# Patient Record
Sex: Female | Born: 1945
Health system: Southern US, Community
[De-identification: ages and names within clinical notes are randomized; demographics above are authoritative.]

## PROBLEM LIST (undated history)

## (undated) DIAGNOSIS — G8929 Other chronic pain: Secondary | ICD-10-CM

## (undated) DIAGNOSIS — L280 Lichen simplex chronicus: Secondary | ICD-10-CM

## (undated) DIAGNOSIS — M549 Dorsalgia, unspecified: Secondary | ICD-10-CM

## (undated) DIAGNOSIS — C801 Malignant (primary) neoplasm, unspecified: Secondary | ICD-10-CM

## (undated) DIAGNOSIS — Z923 Personal history of irradiation: Secondary | ICD-10-CM

## (undated) DIAGNOSIS — C50919 Malignant neoplasm of unspecified site of unspecified female breast: Secondary | ICD-10-CM

## (undated) DIAGNOSIS — M1990 Unspecified osteoarthritis, unspecified site: Secondary | ICD-10-CM

## (undated) DIAGNOSIS — K449 Diaphragmatic hernia without obstruction or gangrene: Secondary | ICD-10-CM

## (undated) DIAGNOSIS — Z9289 Personal history of other medical treatment: Secondary | ICD-10-CM

## (undated) DIAGNOSIS — J189 Pneumonia, unspecified organism: Secondary | ICD-10-CM

## (undated) DIAGNOSIS — E785 Hyperlipidemia, unspecified: Secondary | ICD-10-CM

## (undated) DIAGNOSIS — Z9221 Personal history of antineoplastic chemotherapy: Secondary | ICD-10-CM

## (undated) HISTORY — DX: Hyperlipidemia, unspecified: E78.5

## (undated) HISTORY — PX: DILATION AND CURETTAGE OF UTERUS: SHX78

## (undated) HISTORY — PX: COLON SURGERY: SHX602

## (undated) HISTORY — PX: CYST REMOVAL TRUNK: SHX6283

## (undated) HISTORY — PX: OVARY SURGERY: SHX727

## (undated) HISTORY — PX: BREAST CYST EXCISION: SHX579

## (undated) HISTORY — DX: Unspecified osteoarthritis, unspecified site: M19.90

## (undated) HISTORY — PX: ABDOMINAL HYSTERECTOMY: SHX81

---

## 2000-08-03 DIAGNOSIS — Z9221 Personal history of antineoplastic chemotherapy: Secondary | ICD-10-CM

## 2000-08-03 DIAGNOSIS — Z923 Personal history of irradiation: Secondary | ICD-10-CM

## 2000-08-03 DIAGNOSIS — C50919 Malignant neoplasm of unspecified site of unspecified female breast: Secondary | ICD-10-CM

## 2000-08-03 HISTORY — DX: Personal history of antineoplastic chemotherapy: Z92.21

## 2000-08-03 HISTORY — DX: Personal history of irradiation: Z92.3

## 2000-08-03 HISTORY — DX: Malignant neoplasm of unspecified site of unspecified female breast: C50.919

## 2000-08-03 HISTORY — PX: LYMPHADENECTOMY: SHX15

## 2000-08-03 HISTORY — PX: BREAST LUMPECTOMY: SHX2

## 2001-01-01 DIAGNOSIS — C50919 Malignant neoplasm of unspecified site of unspecified female breast: Secondary | ICD-10-CM | POA: Insufficient documentation

## 2010-12-12 ENCOUNTER — Emergency Department (HOSPITAL_COMMUNITY)
Admission: EM | Admit: 2010-12-12 | Discharge: 2010-12-12 | Discharge status: Against Medical Advice | Payer: BC Managed Care – PPO | Attending: Emergency Medicine | Admitting: Emergency Medicine

## 2010-12-12 DIAGNOSIS — R109 Unspecified abdominal pain: Secondary | ICD-10-CM | POA: Insufficient documentation

## 2010-12-12 DIAGNOSIS — M549 Dorsalgia, unspecified: Secondary | ICD-10-CM | POA: Insufficient documentation

## 2012-09-20 ENCOUNTER — Emergency Department (HOSPITAL_COMMUNITY)
Admission: EM | Admit: 2012-09-20 | Discharge: 2012-09-20 | Disposition: A | Discharge status: Home / Self Care | Payer: Medicare Other | Attending: Emergency Medicine | Admitting: Emergency Medicine

## 2012-09-20 ENCOUNTER — Emergency Department (HOSPITAL_COMMUNITY): Payer: Medicare Other

## 2012-09-20 ENCOUNTER — Encounter (HOSPITAL_COMMUNITY): Payer: Self-pay | Admitting: Emergency Medicine

## 2012-09-20 DIAGNOSIS — R141 Gas pain: Secondary | ICD-10-CM | POA: Insufficient documentation

## 2012-09-20 DIAGNOSIS — R112 Nausea with vomiting, unspecified: Secondary | ICD-10-CM | POA: Insufficient documentation

## 2012-09-20 DIAGNOSIS — I10 Essential (primary) hypertension: Secondary | ICD-10-CM | POA: Insufficient documentation

## 2012-09-20 DIAGNOSIS — Z862 Personal history of diseases of the blood and blood-forming organs and certain disorders involving the immune mechanism: Secondary | ICD-10-CM | POA: Insufficient documentation

## 2012-09-20 DIAGNOSIS — K8020 Calculus of gallbladder without cholecystitis without obstruction: Secondary | ICD-10-CM

## 2012-09-20 DIAGNOSIS — I2510 Atherosclerotic heart disease of native coronary artery without angina pectoris: Secondary | ICD-10-CM | POA: Insufficient documentation

## 2012-09-20 DIAGNOSIS — Z86711 Personal history of pulmonary embolism: Secondary | ICD-10-CM | POA: Insufficient documentation

## 2012-09-20 DIAGNOSIS — R7989 Other specified abnormal findings of blood chemistry: Secondary | ICD-10-CM | POA: Insufficient documentation

## 2012-09-20 DIAGNOSIS — Z8639 Personal history of other endocrine, nutritional and metabolic disease: Secondary | ICD-10-CM | POA: Insufficient documentation

## 2012-09-20 DIAGNOSIS — K449 Diaphragmatic hernia without obstruction or gangrene: Secondary | ICD-10-CM | POA: Insufficient documentation

## 2012-09-20 DIAGNOSIS — R1013 Epigastric pain: Secondary | ICD-10-CM | POA: Insufficient documentation

## 2012-09-20 DIAGNOSIS — R142 Eructation: Secondary | ICD-10-CM | POA: Insufficient documentation

## 2012-09-20 DIAGNOSIS — R945 Abnormal results of liver function studies: Secondary | ICD-10-CM

## 2012-09-20 DIAGNOSIS — Z853 Personal history of malignant neoplasm of breast: Secondary | ICD-10-CM | POA: Insufficient documentation

## 2012-09-20 HISTORY — DX: Malignant (primary) neoplasm, unspecified: C80.1

## 2012-09-20 LAB — TYPE AND SCREEN
ABO/RH(D): A POS
Antibody Screen: NEGATIVE

## 2012-09-20 LAB — COMPREHENSIVE METABOLIC PANEL
ALT: 395 U/L — ABNORMAL HIGH (ref 0–35)
AST: 408 U/L — ABNORMAL HIGH (ref 0–37)
Alkaline Phosphatase: 191 U/L — ABNORMAL HIGH (ref 39–117)
Calcium: 9.5 mg/dL (ref 8.4–10.5)
GFR calc Af Amer: 78 mL/min — ABNORMAL LOW (ref >90)
Glucose, Bld: 157 mg/dL — ABNORMAL HIGH (ref 70–99)
Potassium: 4.1 mEq/L (ref 3.5–5.1)
Sodium: 139 mEq/L (ref 135–145)
Total Protein: 7.2 g/dL (ref 6.0–8.3)

## 2012-09-20 LAB — CBC WITH DIFFERENTIAL/PLATELET
Basophils Absolute: 0 10*3/uL (ref 0.0–0.1)
Eosinophils Absolute: 0 10*3/uL (ref 0.0–0.7)
Lymphocytes Relative: 13 % (ref 12–46)
Lymphs Abs: 0.7 10*3/uL (ref 0.7–4.0)
Neutrophils Relative %: 82 % — ABNORMAL HIGH (ref 43–77)
Platelets: 183 10*3/uL (ref 150–400)
RBC: 4.63 MIL/uL (ref 3.87–5.11)
RDW: 12.5 % (ref 11.5–15.5)
WBC: 5.5 10*3/uL (ref 4.0–10.5)

## 2012-09-20 LAB — ABO/RH: ABO/RH(D): A POS

## 2012-09-20 MED ORDER — ONDANSETRON HCL 4 MG/2ML IJ SOLN
4.0000 mg | Freq: Once | INTRAMUSCULAR | Status: AC
  Administered 2012-09-20: 4 mg via INTRAVENOUS
  Filled 2012-09-20: qty 2

## 2012-09-20 MED ORDER — HYDROMORPHONE HCL PF 1 MG/ML IJ SOLN
1.0000 mg | INTRAMUSCULAR | Status: AC
  Administered 2012-09-20: 1 mg via INTRAVENOUS
  Filled 2012-09-20: qty 1

## 2012-09-20 MED ORDER — LACTATED RINGERS IV SOLN
INTRAVENOUS | Status: DC
  Administered 2012-09-20: 06:00:00 via INTRAVENOUS

## 2012-09-20 MED ORDER — GI COCKTAIL ~~LOC~~
30.0000 mL | Freq: Once | ORAL | Status: DC
  Filled 2012-09-20: qty 30

## 2012-09-20 NOTE — ED Provider Notes (Addendum)
History     CSN: [id number]  Arrival date & time 09/20/12  0500   First MD Initiated Contact with Patient 09/20/12 [phone]      Chief Complaint  Patient presents with  . Abdominal Pain    (Consider location/radiation/quality/duration/timing/severity/associated sxs/prior treatment) HPI Christina Beasley is a 67 y.o. female who is been living in [place] for the last 3 years having moved from [place], [place] and has a past medical history significant for breast cancer and a hiatal hernia from which she suffered "hiatal hernia attacks" these presented as epigastric pain associated with some bloating and sharp gas pains. She says it feels like a knife is stabbing her, the pain is severe and 10 out of 10, it does not radiate, associated with nausea vomiting x7, no diarrhea, no fevers, no chills, no productive cough, no chest pain.   Patient has a history of coronary artery disease, hypercholesterolemia, hypertension, history of PE or hemoptysis.  No blood in the stools or melena. Patient says she does not typically take any medicine on a daily basis except a baby aspirin. Patient also took some ibuprofen yesterday for her right knee pain. Right knee pain is mild to moderate it is worse on sitting for prolonged periods or using her need for prolonged periods. No other abdominal pain.    Past Medical History  Diagnosis Date  . Cancer     breast    Past Surgical History  Procedure Laterality Date  . Breast lumpectomy Left     No family history on file.  History  Substance Use Topics  . Smoking status: Not on file  . Smokeless tobacco: Not on file  . Alcohol Use: Not on file    OB History   Grav Para Term Preterm Abortions TAB SAB Ect Mult Living                  Review of Systems At least 10pt or greater review of systems completed and are negative except where specified in the HPI.  Allergies  Review of patient's allergies indicates no known allergies.  Home Medications  No  current outpatient prescriptions on file.  BP 150/73  Temp(Src) 98 F (36.7 C) (Oral)  Resp 22  SpO2 99%  Physical Exam  Nursing notes reviewed.  Electronic medical record reviewed. VITAL SIGNS:   Filed Vitals:   09/20/12 0506 09/20/12 0530  BP: 150/73 140/81  Pulse:  72  Temp: 98 F (36.7 C) 97.9 F (36.6 C)  TempSrc: Oral Oral  Resp: 22 22  SpO2: 99% 99%   CONSTITUTIONAL: Awake, oriented, appears non-toxic HENT: Atraumatic, normocephalic, oral mucosa pink and moist, airway patent. Nares patent without drainage. External ears normal. EYES: Conjunctiva clear, EOMI, PERRLA NECK: Trachea midline, non-tender, supple CARDIOVASCULAR: Normal heart rate, Normal rhythm, No murmurs, rubs, gallops PULMONARY/CHEST: Clear to auscultation, no rhonchi, wheezes, or rales. Symmetrical breath sounds. Non-tender. ABDOMINAL: Non-distended, soft, tenderness to palpation in the epigastrium without rebound or guarding.  BS normal. NEUROLOGIC: Non-focal, moving all four extremities, no gross sensory or motor deficits. EXTREMITIES: No clubbing, cyanosis, or edema SKIN: Warm, Dry, No erythema, No rash  ED Course  Procedures (including critical care time)  Date: 09/20/2012  Rate: 62  Rhythm: normal sinus rhythm  QRS Axis: normal  Intervals: normal  ST/T Wave abnormalities: normal  Conduction Disutrbances: none  Narrative Interpretation: unremarkable - no prior ECG for comparison  Labs Reviewed  CBC WITH DIFFERENTIAL - Abnormal; Notable for the following:  Neutrophils Relative 82 (*)    All other components within normal limits  COMPREHENSIVE METABOLIC PANEL - Abnormal; Notable for the following:    Glucose, Bld 157 (*)    AST 408 (*)    ALT 395 (*)    Alkaline Phosphatase 191 (*)    Total Bilirubin 2.4 (*)    GFR calc non Af Amer 67 (*)    GFR calc Af Amer 78 (*)    All other components within normal limits  LIPASE, BLOOD - Abnormal; Notable for the following:    Lipase 74 (*)     All other components within normal limits  LACTIC ACID, PLASMA - Abnormal; Notable for the following:    Lactic Acid, Venous 2.6 (*)    All other components within normal limits  URINALYSIS, ROUTINE W REFLEX MICROSCOPIC  TYPE AND SCREEN   Dg Abd Acute W/chest  09/20/2012  *RADIOLOGY REPORT*  Clinical Data: Upper abdominal pain  ACUTE ABDOMEN SERIES (ABDOMEN 2 VIEW & CHEST 1 VIEW)  Comparison: None.  Findings: Lungs are clear. Retrocardiac air fluid level is favored to reflect a moderate sized hiatal hernia.  No free intraperitoneal air. The bowel gas pattern is non-obstructive. Organ outlines are normal where seen. No acute or aggressive osseous abnormality identified. Small rounded focus with sclerotic margins within the right humeral head is nonspecific however nonaggressive in appearance.  S-shaped thoracolumbar curvature.  IMPRESSION: Nonobstructive bowel gas pattern.  Moderate sized hiatal hernia.   Original Report Authenticated By: Jearld Lesch, M.D.      1. Epigastric pain   2. Nausea and vomiting   3. Hiatal hernia   4. Abnormal liver function tests       MDM  Christina Beasley is a 67 y.o. female presenting with epigastric pain which she thinks is a "hiatal hernia attack."  I have concern that these attacks are biliary in nature, the last one being 12/12/2010 where she was bedded in the ER at [facility] but was never seen and her pain spontaneously resolved.  It also happened previously in [place].  Differential diagnosis of her epigastric pain includes: Functional or nonulcer dyspepsia, PUD, GERD, Gastritis, pancreatitis or pancreatic cancer, overeating indigestion (high-fat foods, coffee), drugs ( in her case aspirin and ibuprofen), gastric cancer, cholelithiasis, choledocholithiasis, or cholangitis, ACS, pericarditis, pneumonia, abdominal hernia, intestinal ischemia, esophageal rupture, gastric volvulus, hepatitis.  I don't think the patient has a perforated viscus and she is afebrile and  non-toxic, while in pain, she is in no acute distress so I think gastric volvulus is unlikely.  She's had 7 episodes of vomiting, but her abdomen is soft and otherwise fairly unremarkable on exam.  Pt has no h/o of preceding infection, productive cough, chest pain, SOB, CAD etc - and so ACS, pericarditis seem unlikely, similarly sx not c/w PNA.  High on my list at this point is cholelithiasis, choledocholithiasis, PUD/GERD.  Will get labs including lactate, XR of chest and abdomen, CBC and CMP.  Treat pt pain and nausea/vomiting aggressively.  Results indicate a likely biliary obstruction w/ hepatitis, and very mild pancreatitis.    PT reassessed, is pain free and hads mild nausea, she declines further nausea medicine. Discussed obtaining US of the abdomen to further characterize disease process.  [place] Pending, D/W Dr. Ignacia Palma to follow - please see his note for final disposition.        Jones Skene, MD 09/20/12 1610  Jones Skene, MD 09/20/12 [id number]

## 2012-09-20 NOTE — ED Notes (Signed)
NAD noted at time of d/c home with friend 

## 2012-09-20 NOTE — ED Notes (Signed)
Patient transported by [facility].  Patient complaint of upper abdominal pain since approximately 2000 last night.  Patient has a history of a bowel disection but also reports that she has episodes of gas pain.  EMS reports  BP 152/88, HR 70, 12 Lead EKG-Normal Sinus Rhythm.  Patient has an 18 gauge saline lock in the right AC. Patient has had a left breast lumpectomy.  Patient also vomited x 1.

## 2012-09-20 NOTE — ED Notes (Signed)
Pt to US.

## 2012-09-20 NOTE — ED Notes (Signed)
Pt d/c'd from IV, monitor, continuous pulse oximetry and blood pressure cuff; pt getting dressed to be discharged home 

## 2012-09-20 NOTE — Progress Notes (Signed)
10:02 AM Pt's ultrasound showed multiple small gallstones, common duct not dilated.  I advised her to be seen by the general surgeons, but she did not want to do that, since she thinks this was caused by her hiatal hernia.  I advised her that the tests showed gallstones and elevated liver function tests, but she still insisted on being discharged. She says that she will review this with her internist, Loyal Jacobson, M.D.  I gave her copies of her ultrasound report and lab reports, and advised her that she could return to [facility] ED at any time for re-evaluation.

## 2013-01-25 ENCOUNTER — Other Ambulatory Visit: Payer: Self-pay

## 2013-01-25 DIAGNOSIS — Z9889 Other specified postprocedural states: Secondary | ICD-10-CM

## 2013-01-25 DIAGNOSIS — Z853 Personal history of malignant neoplasm of breast: Secondary | ICD-10-CM

## 2013-01-25 DIAGNOSIS — Z1231 Encounter for screening mammogram for malignant neoplasm of breast: Secondary | ICD-10-CM

## 2013-02-16 ENCOUNTER — Ambulatory Visit
Admission: RE | Admit: 2013-02-16 | Discharge: 2013-02-16 | Disposition: A | Discharge status: Home / Self Care | Payer: Medicare Other | Source: Ambulatory Visit

## 2013-02-16 DIAGNOSIS — Z9889 Other specified postprocedural states: Secondary | ICD-10-CM

## 2013-02-16 DIAGNOSIS — Z1231 Encounter for screening mammogram for malignant neoplasm of breast: Secondary | ICD-10-CM

## 2013-02-16 DIAGNOSIS — Z853 Personal history of malignant neoplasm of breast: Secondary | ICD-10-CM

## 2013-02-17 ENCOUNTER — Ambulatory Visit: Payer: Medicare Other

## 2013-03-03 ENCOUNTER — Encounter (INDEPENDENT_AMBULATORY_CARE_PROVIDER_SITE_OTHER): Payer: Self-pay | Admitting: General Surgery

## 2013-03-03 ENCOUNTER — Ambulatory Visit (INDEPENDENT_AMBULATORY_CARE_PROVIDER_SITE_OTHER): Payer: Medicare Other | Admitting: General Surgery

## 2013-03-03 VITALS — BP 124/80 | HR 72 | Temp 97.7°F | Resp 15 | Ht 60.0 in | Wt 168.4 lb

## 2013-03-03 DIAGNOSIS — K8020 Calculus of gallbladder without cholecystitis without obstruction: Secondary | ICD-10-CM | POA: Insufficient documentation

## 2013-03-03 NOTE — Patient Instructions (Signed)
Plan for laparoscopic cholecystectomy with intraoperative cholangiogram 

## 2013-03-06 NOTE — Progress Notes (Signed)
Subjective:     Patient ID: Christina Beasley, female   DOB: 11/03/1945, 67 y.o.   MRN: [id number]  HPI We're asked to see the patient in consultation by Dr. Leonette Most to evaluate her for gallstones. The patient is a 67 year old white female who has had 2 attacks of left upper quadrant pain over the last 5 months. She describes the pain as severe. The last pain was in February. The pain was associated with nausea but no vomiting. Her ultrasound showed stones in the gallbladder but no gallbladder wall thickening or duct dilatation. Her liver functions were slightly elevated at that time along with her lipase.  Review of Systems  Constitutional: Negative.   HENT: Negative.   Eyes: Negative.   Respiratory: Negative.   Cardiovascular: Negative.   Gastrointestinal: Negative.   Endocrine: Negative.   Genitourinary: Negative.   Musculoskeletal: Negative.   Skin: Negative.   Allergic/Immunologic: Negative.   Neurological: Negative.   Hematological: Negative.   Psychiatric/Behavioral: Negative.        Objective:   Physical Exam  Constitutional: She is oriented to person, place, and time. She appears well-developed and well-nourished.  HENT:  Head: Normocephalic and atraumatic.  Eyes: Conjunctivae and EOM are normal. Pupils are equal, round, and reactive to light.  Neck: Normal range of motion. Neck supple.  Cardiovascular: Normal rate, regular rhythm and normal heart sounds.   Pulmonary/Chest: Effort normal and breath sounds normal.  Abdominal: Soft. Bowel sounds are normal.  There is some tenderness in the upper abdomen, left greater than right  Musculoskeletal: Normal range of motion.  Neurological: She is alert and oriented to person, place, and time.  Skin: Skin is warm and dry.  Psychiatric: She has a normal mood and affect. Her behavior is normal.       Assessment:     The patient has what sounds like symptomatic gallstones although the pain in the left upper quadrant is a little bit  atypical. A cousin risk of further painful episodes and possible pancreatitis I think she would benefit from having her gallbladder removed. I've discussed with her in detail the risks and benefits of the operation to remove the gallbladder as well as some of the technical aspects including the possibility of having to do this open given her previous abdominal surgery and she understands and wishes to proceed     Plan:     Plan for laparoscopic and possible open cholecystectomy with intraoperative cholangiogram

## 2013-03-09 ENCOUNTER — Telehealth (INDEPENDENT_AMBULATORY_CARE_PROVIDER_SITE_OTHER): Payer: Self-pay | Admitting: *Deleted
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
## 2013-03-22 ENCOUNTER — Encounter (HOSPITAL_COMMUNITY): Payer: Self-pay | Admitting: Pharmacy Technician

## 2013-03-22 NOTE — Pre-Procedure Instructions (Signed)
Christina Beasley  03/22/2013   Your procedure is scheduled on:  Friday, March 31, 2013   Report to [facility] at 7:30 AM.  Call this number if you have problems the morning of surgery: [phone]   Remember:   Do not eat food or drink liquids after midnight.   Take these medicines the morning of surgery with A SIP OF WATER: none   Discontinue Aspirin, Coumadin, Plavix, Effient and herbal medication 7 days prior to surgery.    Do not wear jewelry, make-up or nail polish.  Do not wear lotions, powders, or perfumes. You may wear deodorant.  Do not shave 48 hours prior to surgery. Men may shave face and neck.  Do not bring valuables to the hospital.  [facility] is not responsible   for any belongings or valuables.    Contacts, dentures or bridgework may not be worn into surgery.  Leave suitcase in the car. After surgery it may be brought to your room.  For patients admitted to the hospital, checkout time is 11:00 AM the day of  discharge.     Special Instructions: Shower using CHG 2 nights before surgery and the night before surgery.  If you shower the day of surgery use CHG.  Use special wash - you have one bottle of CHG for all showers.  You should use approximately 1/3 of the bottle for each shower.   Please read over the following fact sheets that you were given: Pain Booklet, Coughing and Deep Breathing and Surgical Site Infection Prevention

## 2013-03-23 ENCOUNTER — Encounter (HOSPITAL_COMMUNITY)
Admission: RE | Admit: 2013-03-23 | Discharge: 2013-03-23 | Disposition: A | Discharge status: Home / Self Care | Payer: Medicare Other | Source: Ambulatory Visit | Attending: General Surgery | Admitting: General Surgery

## 2013-03-23 ENCOUNTER — Encounter (HOSPITAL_COMMUNITY): Payer: Self-pay

## 2013-03-23 DIAGNOSIS — Z01812 Encounter for preprocedural laboratory examination: Secondary | ICD-10-CM | POA: Insufficient documentation

## 2013-03-23 HISTORY — DX: Personal history of other medical treatment: Z92.89

## 2013-03-23 HISTORY — DX: Other chronic pain: G89.29

## 2013-03-23 HISTORY — DX: Pneumonia, unspecified organism: J18.9

## 2013-03-23 HISTORY — DX: Dorsalgia, unspecified: M54.9

## 2013-03-23 LAB — CBC WITH DIFFERENTIAL/PLATELET
Basophils Relative: 1 % (ref 0–1)
Eosinophils Relative: 2 % (ref 0–5)
HCT: 44.2 % (ref 36.0–46.0)
Hemoglobin: 16.1 g/dL — ABNORMAL HIGH (ref 12.0–15.0)
MCH: 34.1 pg — ABNORMAL HIGH (ref 26.0–34.0)
MCHC: 36.4 g/dL — ABNORMAL HIGH (ref 30.0–36.0)
MCV: 93.6 fL (ref 78.0–100.0)
Monocytes Absolute: 0.5 10*3/uL (ref 0.1–1.0)
Monocytes Relative: 10 % (ref 3–12)
Neutro Abs: 3 10*3/uL (ref 1.7–7.7)
RDW: 12.4 % (ref 11.5–15.5)

## 2013-03-23 LAB — COMPREHENSIVE METABOLIC PANEL
Albumin: 3.8 g/dL (ref 3.5–5.2)
BUN: 15 mg/dL (ref 6–23)
Calcium: 9.8 mg/dL (ref 8.4–10.5)
Chloride: 105 mEq/L (ref 96–112)
Creatinine, Ser: 0.93 mg/dL (ref 0.50–1.10)
GFR calc non Af Amer: 62 mL/min — ABNORMAL LOW (ref >90)
Total Bilirubin: 0.3 mg/dL (ref 0.3–1.2)

## 2013-03-23 NOTE — Progress Notes (Signed)
Primary physician - Dr. Leonette Most Does not have a cardiologist No recent cardiac testing. Had not seen a doctor in 4 years

## 2013-03-29 MED ORDER — HYDROMORPHONE HCL PF 1 MG/ML IJ SOLN
INTRAMUSCULAR | Status: AC
  Filled 2013-03-29: qty 1

## 2013-03-30 MED ORDER — CEFAZOLIN SODIUM-DEXTROSE 2-3 GM-% IV SOLR
2.0000 g | INTRAVENOUS | Status: AC
  Administered 2013-03-31: 2 g via INTRAVENOUS
  Filled 2013-03-30: qty 50

## 2013-03-31 ENCOUNTER — Ambulatory Visit (HOSPITAL_COMMUNITY): Payer: Medicare Other | Admitting: Anesthesiology

## 2013-03-31 ENCOUNTER — Ambulatory Visit (HOSPITAL_COMMUNITY)
Admission: RE | Admit: 2013-03-31 | Discharge: 2013-04-01 | Disposition: A | Discharge status: Home / Self Care | Payer: Medicare Other | Source: Ambulatory Visit | Attending: General Surgery | Admitting: General Surgery

## 2013-03-31 ENCOUNTER — Ambulatory Visit (HOSPITAL_COMMUNITY): Payer: Medicare Other

## 2013-03-31 ENCOUNTER — Encounter (HOSPITAL_COMMUNITY)
Admission: RE | Disposition: A | Discharge status: Home / Self Care | Payer: Self-pay | Source: Ambulatory Visit | Attending: General Surgery

## 2013-03-31 ENCOUNTER — Encounter (HOSPITAL_COMMUNITY): Payer: Self-pay | Admitting: *Deleted

## 2013-03-31 ENCOUNTER — Encounter (HOSPITAL_COMMUNITY): Payer: Self-pay | Admitting: Anesthesiology

## 2013-03-31 DIAGNOSIS — K8010 Calculus of gallbladder with chronic cholecystitis without obstruction: Secondary | ICD-10-CM | POA: Insufficient documentation

## 2013-03-31 DIAGNOSIS — K219 Gastro-esophageal reflux disease without esophagitis: Secondary | ICD-10-CM | POA: Insufficient documentation

## 2013-03-31 DIAGNOSIS — K660 Peritoneal adhesions (postprocedural) (postinfection): Secondary | ICD-10-CM | POA: Insufficient documentation

## 2013-03-31 DIAGNOSIS — K8020 Calculus of gallbladder without cholecystitis without obstruction: Secondary | ICD-10-CM

## 2013-03-31 DIAGNOSIS — K449 Diaphragmatic hernia without obstruction or gangrene: Secondary | ICD-10-CM | POA: Insufficient documentation

## 2013-03-31 HISTORY — PX: CHOLECYSTECTOMY: SHX55

## 2013-03-31 HISTORY — DX: Diaphragmatic hernia without obstruction or gangrene: K44.9

## 2013-03-31 SURGERY — LAPAROSCOPIC CHOLECYSTECTOMY WITH INTRAOPERATIVE CHOLANGIOGRAM
Anesthesia: General | Site: Abdomen | Wound class: Clean Contaminated

## 2013-03-31 MED ORDER — OXYCODONE HCL 5 MG/5ML PO SOLN: 5.0000 mg | Freq: Once | ORAL | Status: DC | PRN

## 2013-03-31 MED ORDER — KCL IN DEXTROSE-NACL 20-5-0.9 MEQ/L-%-% IV SOLN
INTRAVENOUS | Status: DC
  Administered 2013-03-31: 15:00:00 via INTRAVENOUS
  Filled 2013-03-31 (×2): qty 1000

## 2013-03-31 MED ORDER — LACTATED RINGERS IV SOLN
INTRAVENOUS | Status: DC | PRN
  Administered 2013-03-31 (×2): via INTRAVENOUS

## 2013-03-31 MED ORDER — MEPERIDINE HCL 25 MG/ML IJ SOLN: 6.2500 mg | INTRAMUSCULAR | Status: DC | PRN

## 2013-03-31 MED ORDER — LIDOCAINE HCL (CARDIAC) 20 MG/ML IV SOLN
INTRAVENOUS | Status: DC | PRN
  Administered 2013-03-31: 60 mg via INTRAVENOUS
  Administered 2013-03-31: 40 mg via INTRAVENOUS

## 2013-03-31 MED ORDER — OXYCODONE HCL 5 MG PO TABS: 5.0000 mg | ORAL_TABLET | Freq: Once | ORAL | Status: DC | PRN

## 2013-03-31 MED ORDER — 0.9 % SODIUM CHLORIDE (POUR BTL) OPTIME
TOPICAL | Status: DC | PRN
  Administered 2013-03-31: 1000 mL

## 2013-03-31 MED ORDER — LACTATED RINGERS IV SOLN
INTRAVENOUS | Status: DC
  Administered 2013-03-31: 08:00:00 via INTRAVENOUS

## 2013-03-31 MED ORDER — ONDANSETRON HCL 4 MG PO TABS: 4.0000 mg | ORAL_TABLET | Freq: Four times a day (QID) | ORAL | Status: DC | PRN

## 2013-03-31 MED ORDER — FENTANYL CITRATE 0.05 MG/ML IJ SOLN
INTRAMUSCULAR | Status: DC | PRN
  Administered 2013-03-31: 100 ug via INTRAVENOUS
  Administered 2013-03-31 (×3): 50 ug via INTRAVENOUS

## 2013-03-31 MED ORDER — PROPOFOL 10 MG/ML IV BOLUS
INTRAVENOUS | Status: DC | PRN
  Administered 2013-03-31: 150 mg via INTRAVENOUS

## 2013-03-31 MED ORDER — PROMETHAZINE HCL 25 MG/ML IJ SOLN: 6.2500 mg | INTRAMUSCULAR | Status: DC | PRN

## 2013-03-31 MED ORDER — SODIUM CHLORIDE 0.9 % IV SOLN
INTRAVENOUS | Status: DC | PRN
  Administered 2013-03-31: 09:00:00

## 2013-03-31 MED ORDER — NEOSTIGMINE METHYLSULFATE 1 MG/ML IJ SOLN
INTRAMUSCULAR | Status: DC | PRN
  Administered 2013-03-31: 3 mg via INTRAVENOUS

## 2013-03-31 MED ORDER — ONDANSETRON HCL 4 MG/2ML IJ SOLN
INTRAMUSCULAR | Status: DC | PRN
  Administered 2013-03-31: 4 mg via INTRAVENOUS

## 2013-03-31 MED ORDER — DIPHENHYDRAMINE HCL 50 MG/ML IJ SOLN
25.0000 mg | Freq: Four times a day (QID) | INTRAMUSCULAR | Status: DC | PRN
  Administered 2013-03-31 – 2013-04-01 (×2): 25 mg via INTRAVENOUS
  Filled 2013-03-31 (×2): qty 1

## 2013-03-31 MED ORDER — GLYCOPYRROLATE 0.2 MG/ML IJ SOLN
INTRAMUSCULAR | Status: DC | PRN
  Administered 2013-03-31: 0.6 mg via INTRAVENOUS

## 2013-03-31 MED ORDER — BUPIVACAINE-EPINEPHRINE PF 0.25-1:200000 % IJ SOLN
INTRAMUSCULAR | Status: AC
  Filled 2013-03-31: qty 30

## 2013-03-31 MED ORDER — HYDROMORPHONE HCL PF 1 MG/ML IJ SOLN
INTRAMUSCULAR | Status: AC
  Filled 2013-03-31: qty 1

## 2013-03-31 MED ORDER — MORPHINE SULFATE 4 MG/ML IJ SOLN: 4.0000 mg | INTRAMUSCULAR | Status: DC | PRN

## 2013-03-31 MED ORDER — GADOBENATE DIMEGLUMINE 529 MG/ML IV SOLN
15.0000 mL | Freq: Once | INTRAVENOUS | Status: AC | PRN
  Administered 2013-03-31: 15 mL via INTRAVENOUS

## 2013-03-31 MED ORDER — SODIUM CHLORIDE 0.9 % IR SOLN
Status: DC | PRN
  Administered 2013-03-31: 1000 mL

## 2013-03-31 MED ORDER — CHLORHEXIDINE GLUCONATE 4 % EX LIQD: 1.0000 "application " | Freq: Once | CUTANEOUS | Status: DC

## 2013-03-31 MED ORDER — DEXTROSE-NACL 5-0.9 % IV SOLN
INTRAVENOUS | Status: DC
  Administered 2013-03-31: 20:00:00 via INTRAVENOUS

## 2013-03-31 MED ORDER — MIDAZOLAM HCL 5 MG/5ML IJ SOLN
INTRAMUSCULAR | Status: DC | PRN
  Administered 2013-03-31: 1 mg via INTRAVENOUS

## 2013-03-31 MED ORDER — BUPIVACAINE-EPINEPHRINE 0.25% -1:200000 IJ SOLN
INTRAMUSCULAR | Status: DC | PRN
  Administered 2013-03-31: 28 mL

## 2013-03-31 MED ORDER — WHITE PETROLATUM GEL
Status: AC
  Administered 2013-03-31: 0.2
  Filled 2013-03-31: qty 5

## 2013-03-31 MED ORDER — ROCURONIUM BROMIDE 100 MG/10ML IV SOLN
INTRAVENOUS | Status: DC | PRN
  Administered 2013-03-31: 50 mg via INTRAVENOUS

## 2013-03-31 MED ORDER — ONDANSETRON HCL 4 MG/2ML IJ SOLN
4.0000 mg | Freq: Four times a day (QID) | INTRAMUSCULAR | Status: DC | PRN
  Administered 2013-03-31: 4 mg via INTRAVENOUS
  Filled 2013-03-31: qty 2

## 2013-03-31 MED ORDER — MIDAZOLAM HCL 2 MG/2ML IJ SOLN: 0.5000 mg | Freq: Once | INTRAMUSCULAR | Status: DC | PRN

## 2013-03-31 MED ORDER — OXYCODONE HCL 5 MG PO TABS
ORAL_TABLET | ORAL | Status: AC
  Administered 2013-03-31: 5 mg
  Filled 2013-03-31: qty 1

## 2013-03-31 MED ORDER — GLUCAGON HCL (RDNA) 1 MG IJ SOLR
INTRAMUSCULAR | Status: DC | PRN
  Administered 2013-03-31: 1 mg via INTRAVENOUS

## 2013-03-31 MED ORDER — GLUCAGON HCL (RDNA) 1 MG IJ SOLR
INTRAMUSCULAR | Status: AC
  Filled 2013-03-31: qty 1

## 2013-03-31 MED ORDER — HYDROMORPHONE HCL PF 1 MG/ML IJ SOLN
0.2500 mg | INTRAMUSCULAR | Status: DC | PRN
  Administered 2013-03-31 (×2): 0.25 mg via INTRAVENOUS
  Administered 2013-03-31: 0.5 mg via INTRAVENOUS

## 2013-03-31 SURGICAL SUPPLY — 47 items
APPLIER CLIP 5 13 M/L LIGAMAX5 (MISCELLANEOUS) ×2
APPLIER CLIP ROT 10 11.4 M/L (STAPLE) ×2
BLADE SURG ROTATE 9660 (MISCELLANEOUS) IMPLANT
CANISTER SUCTION 2500CC (MISCELLANEOUS) ×2 IMPLANT
CATH REDDICK CHOLANGI 4FR 50CM (CATHETERS) IMPLANT
CHLORAPREP W/TINT 26ML (MISCELLANEOUS) ×2 IMPLANT
CLIP APPLIE 5 13 M/L LIGAMAX5 (MISCELLANEOUS) ×1 IMPLANT
CLIP APPLIE ROT 10 11.4 M/L (STAPLE) ×1 IMPLANT
CLOTH BEACON ORANGE TIMEOUT ST (SAFETY) ×2 IMPLANT
COVER MAYO STAND STRL (DRAPES) ×2 IMPLANT
COVER SURGICAL LIGHT HANDLE (MISCELLANEOUS) ×2 IMPLANT
DECANTER SPIKE VIAL GLASS SM (MISCELLANEOUS) IMPLANT
DERMABOND ADVANCED (GAUZE/BANDAGES/DRESSINGS) ×1
DERMABOND ADVANCED .7 DNX12 (GAUZE/BANDAGES/DRESSINGS) ×1 IMPLANT
DRAPE C-ARM 42X72 X-RAY (DRAPES) ×2 IMPLANT
DRAPE UTILITY 15X26 W/TAPE STR (DRAPE) ×4 IMPLANT
ELECT REM PT RETURN 9FT ADLT (ELECTROSURGICAL) ×2
ELECTRODE REM PT RTRN 9FT ADLT (ELECTROSURGICAL) ×1 IMPLANT
GLOVE BIO SURGEON STRL SZ7.5 (GLOVE) ×4 IMPLANT
GLOVE BIO SURGEON STRL SZ8 (GLOVE) ×2 IMPLANT
GLOVE BIOGEL PI IND STRL 7.5 (GLOVE) ×1 IMPLANT
GLOVE BIOGEL PI IND STRL 8 (GLOVE) ×1 IMPLANT
GLOVE BIOGEL PI INDICATOR 7.5 (GLOVE) ×1
GLOVE BIOGEL PI INDICATOR 8 (GLOVE) ×1
GLOVE ECLIPSE 8.0 STRL XLNG CF (GLOVE) ×2 IMPLANT
GLOVE SURG SS PI 7.0 STRL IVOR (GLOVE) ×2 IMPLANT
GOWN PREVENTION PLUS XLARGE (GOWN DISPOSABLE) ×2 IMPLANT
GOWN STRL NON-REIN LRG LVL3 (GOWN DISPOSABLE) ×6 IMPLANT
IV CATH 14GX2 1/4 (CATHETERS) ×2 IMPLANT
KIT BASIN OR (CUSTOM PROCEDURE TRAY) ×2 IMPLANT
KIT ROOM TURNOVER OR (KITS) ×2 IMPLANT
NS IRRIG 1000ML POUR BTL (IV SOLUTION) ×2 IMPLANT
PAD ARMBOARD 7.5X6 YLW CONV (MISCELLANEOUS) ×2 IMPLANT
POUCH SPECIMEN RETRIEVAL 10MM (ENDOMECHANICALS) ×2 IMPLANT
SCISSORS LAP 5X35 DISP (ENDOMECHANICALS) ×2 IMPLANT
SET IRRIG TUBING LAPAROSCOPIC (IRRIGATION / IRRIGATOR) ×2 IMPLANT
SLEEVE ENDOPATH XCEL 5M (ENDOMECHANICALS) ×4 IMPLANT
SLEEVE SURGEON STRL (DRAPES) IMPLANT
SPECIMEN JAR SMALL (MISCELLANEOUS) ×2 IMPLANT
SUT MNCRL AB 4-0 PS2 18 (SUTURE) ×4 IMPLANT
SUT MON AB 5-0 PS2 18 (SUTURE) ×2 IMPLANT
TOWEL OR 17X24 6PK STRL BLUE (TOWEL DISPOSABLE) IMPLANT
TOWEL OR 17X26 10 PK STRL BLUE (TOWEL DISPOSABLE) ×2 IMPLANT
TRAY LAPAROSCOPIC (CUSTOM PROCEDURE TRAY) ×2 IMPLANT
TROCAR XCEL BLUNT TIP 100MML (ENDOMECHANICALS) ×2 IMPLANT
TROCAR XCEL NON-BLD 11X100MML (ENDOMECHANICALS) ×2 IMPLANT
TROCAR XCEL NON-BLD 5MMX100MML (ENDOMECHANICALS) ×2 IMPLANT

## 2013-03-31 NOTE — Interval H&P Note (Signed)
History and Physical Interval Note:  03/31/2013 8:56 AM  Christina Beasley  has presented today for surgery, with the diagnosis of gallstones  The various methods of treatment have been discussed with the patient and family. After consideration of risks, benefits and other options for treatment, the patient has consented to  Procedure(s): LAPAROSCOPIC CHOLECYSTECTOMY WITH INTRAOPERATIVE CHOLANGIOGRAM (N/A) as a surgical intervention .  The patient's history has been reviewed, patient examined, no change in status, stable for surgery.  I have reviewed the patient's chart and labs.  Questions were answered to the patient's satisfaction.     [name],[name]

## 2013-03-31 NOTE — Anesthesia Postprocedure Evaluation (Signed)
  Anesthesia Post-op Note  Patient: Christina Beasley  Procedure(s) Performed: Procedure(s): LAPAROSCOPIC CHOLECYSTECTOMY WITH INTRAOPERATIVE CHOLANGIOGRAM (N/A)  Patient Location: PACU  Anesthesia Type:General  Level of Consciousness: awake, alert , oriented and patient cooperative  Airway and Oxygen Therapy: Patient Spontanous Breathing  Post-op Pain: mild  Post-op Assessment: Post-op Vital signs reviewed, Patient's Cardiovascular Status Stable, Respiratory Function Stable, Patent Airway, No signs of Nausea or vomiting and Pain level controlled  Post-op Vital Signs: Reviewed and stable  Complications: No apparent anesthesia complications

## 2013-03-31 NOTE — OR Nursing (Signed)
Confirmed w/ Dr. Carolynne Edouard that patient to remain NPO for ERCP per gastro.

## 2013-03-31 NOTE — H&P (View-Only) (Signed)
Subjective:     Patient ID: Christina Beasley, female   DOB: 09/13/1945, 66 y.o.   MRN: [id number]  HPI We're asked to see the patient in consultation by Dr. Kalish to evaluate her for gallstones. The patient is a 66-year-old white female who has had 2 attacks of left upper quadrant pain over the last 5 months. She describes the pain as severe. The last pain was in February. The pain was associated with nausea but no vomiting. Her ultrasound showed stones in the gallbladder but no gallbladder wall thickening or duct dilatation. Her liver functions were slightly elevated at that time along with her lipase.  Review of Systems  Constitutional: Negative.   HENT: Negative.   Eyes: Negative.   Respiratory: Negative.   Cardiovascular: Negative.   Gastrointestinal: Negative.   Endocrine: Negative.   Genitourinary: Negative.   Musculoskeletal: Negative.   Skin: Negative.   Allergic/Immunologic: Negative.   Neurological: Negative.   Hematological: Negative.   Psychiatric/Behavioral: Negative.        Objective:   Physical Exam  Constitutional: She is oriented to person, place, and time. She appears well-developed and well-nourished.  HENT:  Head: Normocephalic and atraumatic.  Eyes: Conjunctivae and EOM are normal. Pupils are equal, round, and reactive to light.  Neck: Normal range of motion. Neck supple.  Cardiovascular: Normal rate, regular rhythm and normal heart sounds.   Pulmonary/Chest: Effort normal and breath sounds normal.  Abdominal: Soft. Bowel sounds are normal.  There is some tenderness in the upper abdomen, left greater than right  Musculoskeletal: Normal range of motion.  Neurological: She is alert and oriented to person, place, and time.  Skin: Skin is warm and dry.  Psychiatric: She has a normal mood and affect. Her behavior is normal.       Assessment:     The patient has what sounds like symptomatic gallstones although the pain in the left upper quadrant is a little bit  atypical. A cousin risk of further painful episodes and possible pancreatitis I think she would benefit from having her gallbladder removed. I've discussed with her in detail the risks and benefits of the operation to remove the gallbladder as well as some of the technical aspects including the possibility of having to do this open given her previous abdominal surgery and she understands and wishes to proceed     Plan:     Plan for laparoscopic and possible open cholecystectomy with intraoperative cholangiogram       

## 2013-03-31 NOTE — Anesthesia Preprocedure Evaluation (Addendum)
Anesthesia Evaluation  Patient identified by MRN, date of birth, ID band Patient awake    Reviewed: Allergy & Precautions, H&P , NPO status , Patient's Chart, lab work & pertinent test results  History of Anesthesia Complications Negative for: history of anesthetic complications  Airway Mallampati: II TM Distance: >3 FB Neck ROM: Full    Dental  (+) Teeth Intact and Dental Advisory Given   Pulmonary Recent URI ,  breath sounds clear to auscultation  Pulmonary exam normal       Cardiovascular negative cardio ROS  Rhythm:Regular Rate:Normal     Neuro/Psych negative neurological ROS     GI/Hepatic Neg liver ROS, hiatal hernia, GERD-  Controlled,  Endo/Other  Morbid obesity  Renal/GU negative Renal ROS     Musculoskeletal   Abdominal (+) + obese,   Peds  Hematology   Anesthesia Other Findings Breast cancer  Reproductive/Obstetrics                          Anesthesia Physical Anesthesia Plan  ASA: II  Anesthesia Plan: General   Post-op Pain Management:    Induction: Intravenous  Airway Management Planned: Oral ETT  Additional Equipment:   Intra-op Plan:   Post-operative Plan: Extubation in OR  Informed Consent: I have reviewed the patients History and Physical, chart, labs and discussed the procedure including the risks, benefits and alternatives for the proposed anesthesia with the patient or authorized representative who has indicated his/her understanding and acceptance.   Dental advisory given  Plan Discussed with: CRNA and Surgeon  Anesthesia Plan Comments: (Plan routine monitors, GETA)        Anesthesia Quick Evaluation

## 2013-03-31 NOTE — Op Note (Signed)
03/31/2013  10:50 AM  PATIENT:  Christina Beasley  67 y.o. female  PRE-OPERATIVE DIAGNOSIS:  CHOLELITHIASIS  POST-OPERATIVE DIAGNOSIS:  CHOLELITHIASIS  PROCEDURE:  Procedure(s): LAPAROSCOPIC CHOLECYSTECTOMY WITH INTRAOPERATIVE CHOLANGIOGRAM (N/A)  SURGEON:  Surgeon(s) and Role:    * Robyne Askew, MD - Primary    * Ardeth Sportsman, MD - Assisting  PHYSICIAN ASSISTANT:   ASSISTANTS: Dr. Michaell Cowing   ANESTHESIA:   general  EBL:  Total I/O In: 1000 [I.V.:1000] Out: -   BLOOD ADMINISTERED:none  DRAINS: none   LOCAL MEDICATIONS USED:  MARCAINE     SPECIMEN:  Source of Specimen:  gallbladder  DISPOSITION OF SPECIMEN:  PATHOLOGY  COUNTS:  YES  TOURNIQUET:  * No tourniquets in log *  DICTATION: .Dragon Dictation After informed consent was obtained the patient was brought to the operating room and placed in the supine position on the operating room table. After adequate induction of general anesthesia the patient's abdomen was prepped with ChloraPrep, allowed to dry, and draped in usual sterile manner. The area above the umbilicus was infiltrated with quarter percent Marcaine. A small incision was made with a 15 blade knife. This incision was carried through the subcutaneous tissue bluntly with a hemostat and arm and a retractors until the linea alba was identified. The linea alba was incised with a 15 blade knife. Each side was grasped with Kocher clamps and elevated anteriorly. The preperitoneal space was probed bluntly with a hemostat but we were never able to identify a definite free space. We then decided to access the abdominal cavity using an Optiview port in the right upper quadrant. We bluntly dissected under direct vision through the layers of the abdominal wall until we had access to the abdominal cavity. The abdomen was then insufflated with carbon dioxide without difficulty. We were then able to visualize where the supraumbilical incision was and there was some falciform and  omental adhesions in this area that we were able to dissect bluntly through with the finger so that the [facility] cannula would in the abdominal cavity. Care was taken to make sure there was no bowel injury. The Hassan cannula was then placed through the supraumbilical incision into the abdominal cavity and anchored in place with a 0 Vicryl pursestring stitch placed in the fascia. A second 5 mm port site was created on the right lateral abdomen and a 5 mm port was placed bluntly through this incision into the abdominal cavity under direct vision. Next the epigastric area was infiltrated with quarter percent Marcaine. A small stab incision was made with a 15 blade knife and a 5 mm port was placed bluntly through this incision into the abdominal cavity under direct vision as well. A blunt grasper was placed through the lateralmost 5 mm port and used to grasp the dome of the gallbladder and elevated anteriorly and superiorly. Another blunt grasper was placed to the other 5 mm port and used to retract on the body and neck of the gallbladder. A dissector was placed through the epigastric port and using the electrocautery the peritoneal reflection of the gallbladder neck was opened. Blunt dissection was carried out in the same to the gallbladder neck cystic duct junction was readily identified and a good window was created. A clip was placed on the gallbladder neck did a small proctotomy was made just below the clip with the laparoscopic scissors. A 14-gauge Angiocath was placed percutaneously through the anterior abdominal wall under direct vision. A Reddick cholangiogram catheter was placed  through the Angiocath and flushed. The Reddick catheter was then placed within the cystic duct and anchored in place the clip a cholangiogram was obtained that showed good anatomy and a long cystic duct but there was no emptying into the duodenum. We gave an amp of glucagon and then repeated the cholangiogram and there was still no  emptying into the duodenum. At this point the anchoring clip and catheter was removed from the patient. 3 clips were placed proximally on the cystic duct and the duct was divided between the 2 sets of clips. Posterior to this the cystic artery was identified and again dissected bluntly in a circumferential manner until a good window was created. 2 clips placed proximally one distally on the artery and the artery was divided between the 2. A laparoscopic hook cautery device is used to separate the gallbladder from the liver bed. Once the gallbladder was detached from the liver bed the liver bed was inspected and several small bleeding points were coagulated with the electrocautery until the area was completely hemostatic. A laparoscopic bag was placed through the [facility] cannula and the bladder was placed within the bag and the bag was sealed. The abdomen was then irrigated with copious amounts of saline until the effluent was clear. The gallbladder and bag were removed with the [facility] cannula through the supraumbilical port. The fascial defect was then closed with the previously placed Vicryl purse string stitch. The abdomen was inspected again and no other abnormalities were noted other than the adhesions along the lower abdominal wall. The rest of the ports were removed under direct vision and the gas was allowed to escape. The skin incisions were then closed with interrupted 4 Monocryl subcuticular stitches. Dermabond dressings were applied. The patient tolerated the procedure well. At the end of the case all needle sponge and instrument counts were correct. The patient was then awakened and taken to recovery in stable condition.  PLAN OF CARE: Admit for overnight observation  PATIENT DISPOSITION:  PACU - hemodynamically stable.   Delay start of Pharmacological VTE agent (>24hrs) due to surgical blood loss or risk of bleeding: yes

## 2013-03-31 NOTE — Progress Notes (Signed)
Called MD on call re: pt requesting med to help her sleep tonight.

## 2013-03-31 NOTE — Transfer of Care (Signed)
Immediate Anesthesia Transfer of Care Note  Patient: Christina Beasley  Procedure(s) Performed: Procedure(s): LAPAROSCOPIC CHOLECYSTECTOMY WITH INTRAOPERATIVE CHOLANGIOGRAM (N/A)  Patient Location: PACU  Anesthesia Type:General  Level of Consciousness: awake, alert , oriented and patient cooperative  Airway & Oxygen Therapy: Patient Spontanous Breathing and Patient connected to nasal cannula oxygen  Post-op Assessment: Report given to PACU RN, Post -op Vital signs reviewed and stable and Patient moving all extremities  Post vital signs: Reviewed and stable  Complications: No apparent anesthesia complications

## 2013-04-01 MED ORDER — HYDROCODONE-ACETAMINOPHEN 5-325 MG PO TABS
1.0000 | ORAL_TABLET | ORAL | Status: DC | PRN
Start: 2013-04-01 — End: 2013-04-01

## 2013-04-01 NOTE — Discharge Summary (Signed)
Physician Discharge Summary  Patient ID: Christina Beasley MRN: [id number] DOB/AGE: Dec 31, 1945 67 y.o.  Admit date: 03/31/2013 Discharge date: 04/01/2013  Admission Diagnoses:  Discharge Diagnoses:  Active Problems:   * No active hospital problems. *   Discharged Condition: good  Hospital Course: the pt underwent lap chole with ioc. Her cholangiogram showed no emptying into duodenum but her lft's were normal. She was kept overnight. MRCP was negative so she will be discharged with close follow up.  Consults: GI  Significant Diagnostic Studies: MRCP  Treatments: surgery: as above  Discharge Exam: Blood pressure 123/63, pulse 90, temperature 98.5 F (36.9 C), temperature source Oral, resp. rate 20, height 5' (1.524 m), weight 170 lb 10.2 oz (77.4 kg), SpO2 95.00%. GI: soft, nontender  Disposition: 01-Home or Self Care  Discharge Orders   Future Appointments Provider Department Dept Phone   04/17/2013 9:00 AM Robyne Askew, MD [facility], [place] [phone]   Future Orders Complete By Expires   Call MD for:  difficulty breathing, headache or visual disturbances  As directed    Call MD for:  extreme fatigue  As directed    Call MD for:  hives  As directed    Call MD for:  persistant dizziness or light-headedness  As directed    Call MD for:  persistant nausea and vomiting  As directed    Call MD for:  redness, tenderness, or signs of infection (pain, swelling, redness, odor or green/yellow discharge around incision site)  As directed    Call MD for:  severe uncontrolled pain  As directed    Call MD for:  temperature >100.4  As directed    Diet - low sodium heart healthy  As directed    Discharge instructions  As directed    Comments:     May shower. No heavy lifting. Low fat diet   Increase activity slowly  As directed    No wound care  As directed        Medication List         aspirin 81 MG chewable tablet  Chew 81 mg by mouth daily.     multivitamin with  minerals Tabs tablet  Take 1 tablet by mouth daily.           Follow-up Information   Follow up with [name],PAUL S, MD In 3 weeks.   Specialty:  General Surgery   Contact information:   [address] [place] [address] [phone]       Signed: Robyne Askew 04/01/2013, 8:52 AM

## 2013-04-01 NOTE — Progress Notes (Signed)
Pt discharged  To home

## 2013-04-01 NOTE — Progress Notes (Signed)
1 Day Post-Op  Subjective: No complaints other than she is hungry and has not eaten. Denies abdominal pain  Objective: Vital signs in last 24 hours: Temp:  [97.5 F (36.4 C)-98.8 F (37.1 C)] 98.5 F (36.9 C) (08/30 0404) Pulse Rate:  [54-90] 90 (08/30 0404) Resp:  [12-20] 20 (08/30 0404) BP: (104-145)/(58-85) 123/63 mmHg (08/30 0404) SpO2:  [91 %-100 %] 95 % (08/30 0404) Weight:  [170 lb 10.2 oz (77.4 kg)] 170 lb 10.2 oz (77.4 kg) (08/29 1234) Last BM Date: 03/31/13  Intake/Output from previous day: 08/29 0701 - 08/30 0700 In: 1850 [I.V.:1850] Out: 1200 [Urine:1200] Intake/Output this shift:    GI: soft, nontender. incisions look good  Lab Results:  No results found for this basename: WBC, HGB, HCT, PLT,  in the last 72 hours BMET No results found for this basename: NA, K, CL, CO2, GLUCOSE, BUN, CREATININE, CALCIUM,  in the last 72 hours PT/INR No results found for this basename: LABPROT, INR,  in the last 72 hours ABG No results found for this basename: PHART, PCO2, PO2, HCO3,  in the last 72 hours  Studies/Results: Dg Cholangiogram Operative  03/31/2013   *RADIOLOGY REPORT*  Clinical Data: Cholelithiasis  INTRAOPERATIVE CHOLANGIOGRAM  Technique:  Multiple fluoroscopic spot radiographs were obtained during intraoperative cholangiogram and are submitted for interpretation post-operatively.  Comparison: None.  Findings: Small filling defects suggested in  the distal common bile duct, with no passage of contrast into the duodenum. Intrahepatic ducts are incompletely visualized, appearing decompressed centrally.  IMPRESSION  1.  Suspect   obstructing distal common duct calculi.   Original Report Authenticated By: D. Andria Rhein, MD    Anti-infectives: Anti-infectives   Start     Dose/Rate Route Frequency Ordered Stop   03/31/13 0600  ceFAZolin (ANCEF) IVPB 2 g/50 mL premix     2 g 100 mL/hr over 30 Minutes Intravenous On call to O.R. 03/30/13 1448 03/31/13 0929       Assessment/Plan: s/p Procedure(s): LAPAROSCOPIC CHOLECYSTECTOMY WITH INTRAOPERATIVE CHOLANGIOGRAM (N/A) Advance diet MRCP appears neg Will work toward discharge today  LOS: 1 day    [name],Tavis Kring S 04/01/2013

## 2013-04-04 ENCOUNTER — Encounter (HOSPITAL_COMMUNITY): Payer: Self-pay | Admitting: General Surgery

## 2013-04-06 ENCOUNTER — Encounter (HOSPITAL_COMMUNITY): Payer: Self-pay | Admitting: General Surgery

## 2013-04-17 ENCOUNTER — Ambulatory Visit (INDEPENDENT_AMBULATORY_CARE_PROVIDER_SITE_OTHER): Payer: Medicare Other | Admitting: General Surgery

## 2013-04-17 ENCOUNTER — Encounter (INDEPENDENT_AMBULATORY_CARE_PROVIDER_SITE_OTHER): Payer: Self-pay | Admitting: General Surgery

## 2013-04-17 VITALS — BP 124/78 | HR 70 | Resp 14 | Ht 60.0 in | Wt 168.0 lb

## 2013-04-17 DIAGNOSIS — K8020 Calculus of gallbladder without cholecystitis without obstruction: Secondary | ICD-10-CM

## 2013-04-17 NOTE — Progress Notes (Signed)
Subjective:     Patient ID: Christina Beasley, female   DOB: 1946-04-29, 67 y.o.   MRN: [id number]  HPI The patient is a 67 year old white female who is about 2 weeks status post laparoscopic cholecystectomy for cholecystitis with cholelithiasis. She has done very well from a gallbladder standpoint. She is actually eating fast food without any problems at all. Her only complaint is of some soreness in the right hand where her IV was.  Review of Systems     Objective:   Physical Exam On exam her abdomen is soft and nontender. Her incisions are all healing nicely with no sign of infection. She does have some bruising and probably some clot in the superficial vein on the right hand where her IV was.    Assessment:     The patient is 2 weeks status post laparoscopic cholecystectomy     Plan:     At this point I think she can return to her normal activities. I have recommended warm compresses and ibuprofen for the thrombophlebitis in the right hand. I think she will do well. She will call us if she has any problems. Otherwise we will see her back on a when necessary basis

## 2013-04-17 NOTE — Patient Instructions (Addendum)
May return to normal activities Use warm compresses and ibuprofen for right hand

## 2014-01-17 ENCOUNTER — Other Ambulatory Visit: Payer: Self-pay

## 2014-01-17 DIAGNOSIS — Z1231 Encounter for screening mammogram for malignant neoplasm of breast: Secondary | ICD-10-CM

## 2014-02-19 ENCOUNTER — Ambulatory Visit: Payer: Medicare Other

## 2014-02-22 ENCOUNTER — Ambulatory Visit
Admission: RE | Admit: 2014-02-22 | Discharge: 2014-02-22 | Disposition: A | Discharge status: Home / Self Care | Payer: Medicare Other | Source: Ambulatory Visit

## 2014-02-22 DIAGNOSIS — Z1231 Encounter for screening mammogram for malignant neoplasm of breast: Secondary | ICD-10-CM

## 2014-08-17 ENCOUNTER — Other Ambulatory Visit: Payer: Self-pay | Admitting: Family Medicine

## 2014-08-17 DIAGNOSIS — E2839 Other primary ovarian failure: Secondary | ICD-10-CM

## 2014-08-24 ENCOUNTER — Other Ambulatory Visit: Payer: Medicare Other

## 2014-09-01 IMAGING — US US ABDOMEN COMPLETE
1 series · 14 of 25 positions shown · non-contrast
Comparison: None.

CLINICAL DATA: Epigastric pain, nausea/vomiting, history of breast
cancer

COMPLETE ABDOMINAL ULTRASOUND

[Series 1: us abdomen complete · 0.40mm/px · 14 of 83 slices shown]
[im 1/83]
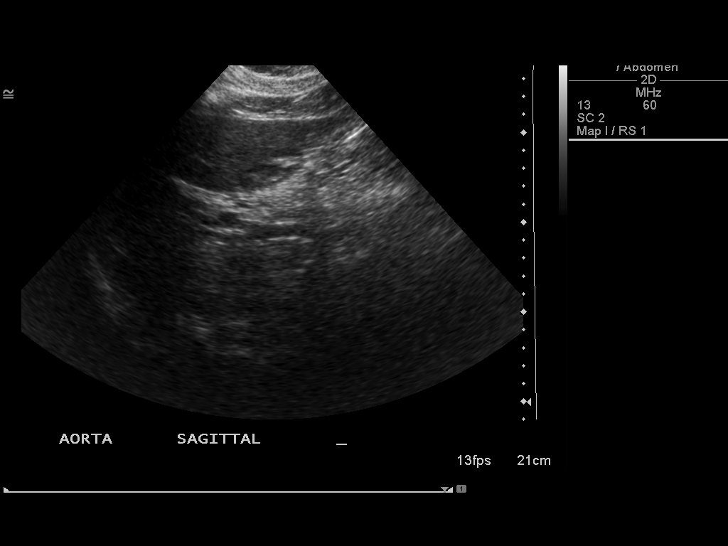
[im 7/83]
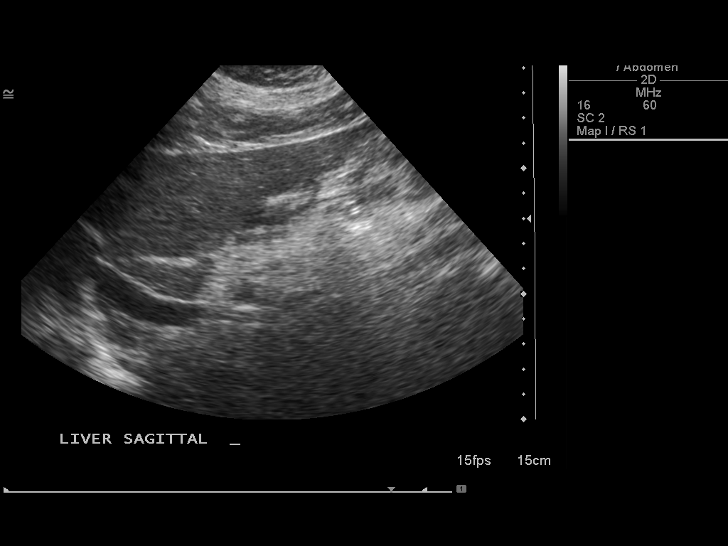
[im 14/83]
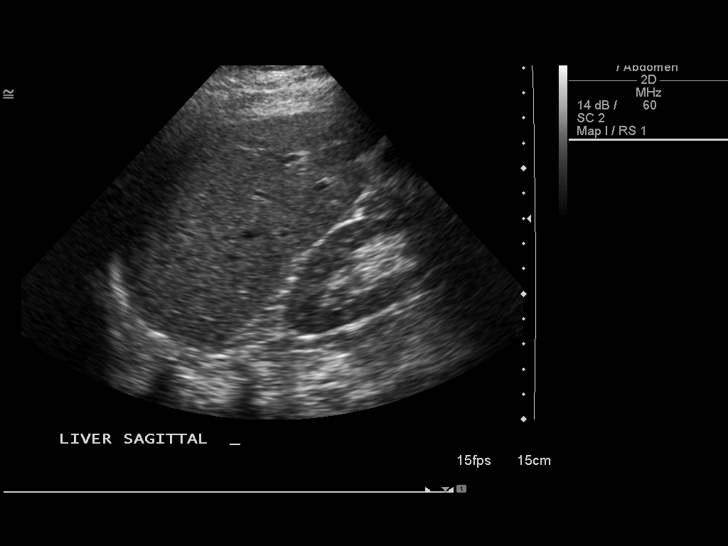
[im 21/83]
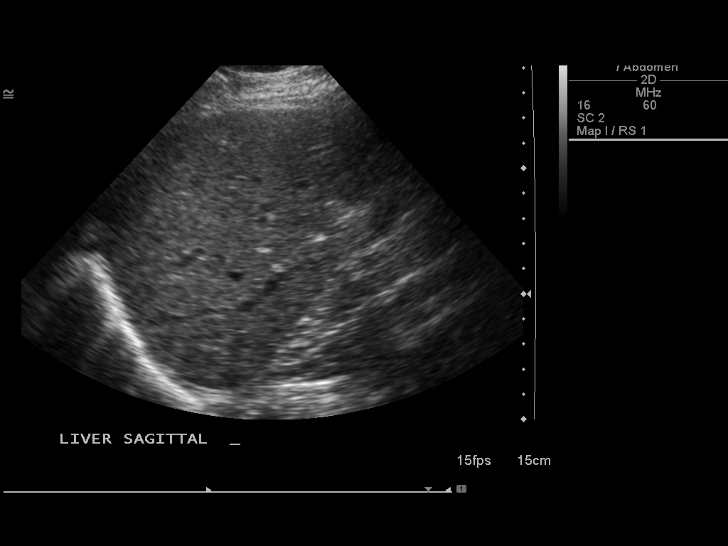
[im 28/83]
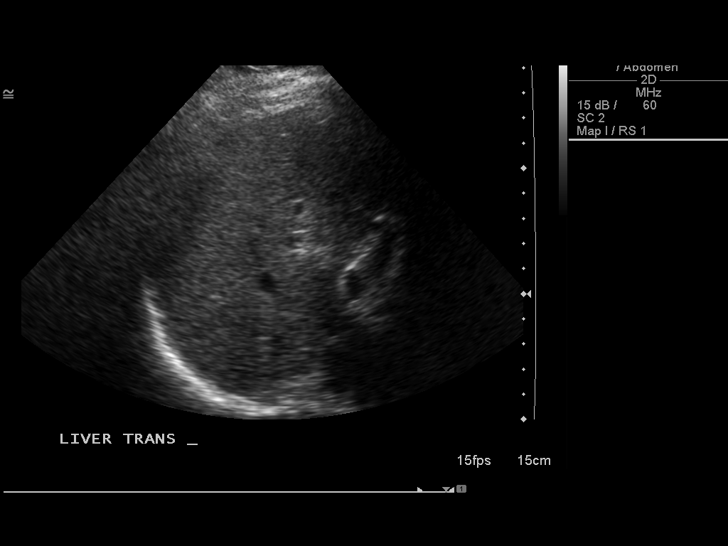
[im 31/83]
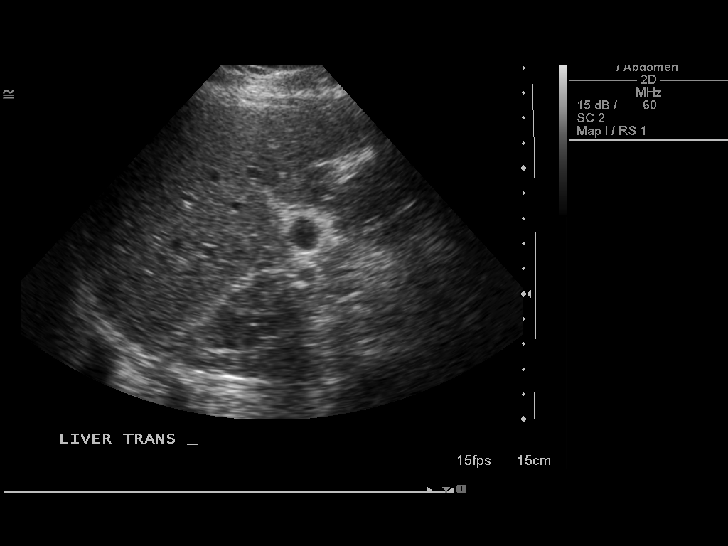
[im 38/83]
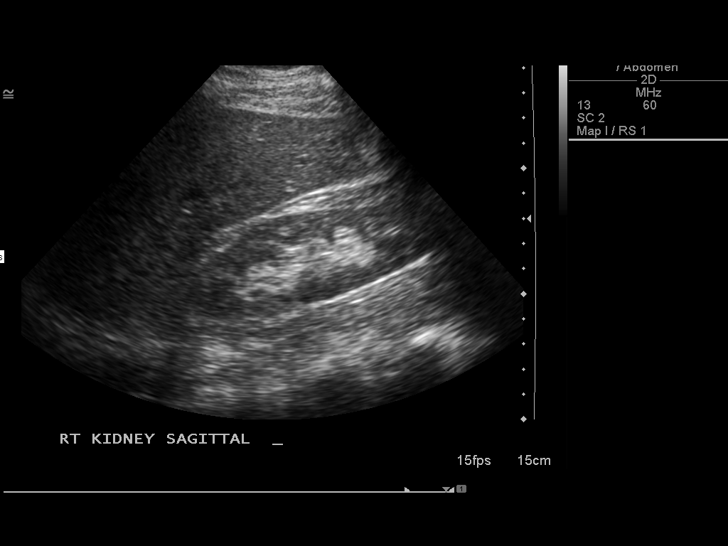
[im 45/83]
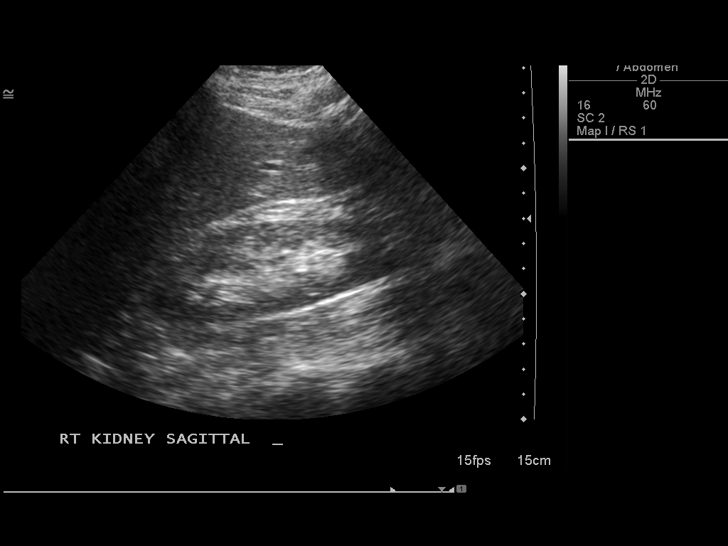
[im 52/83]
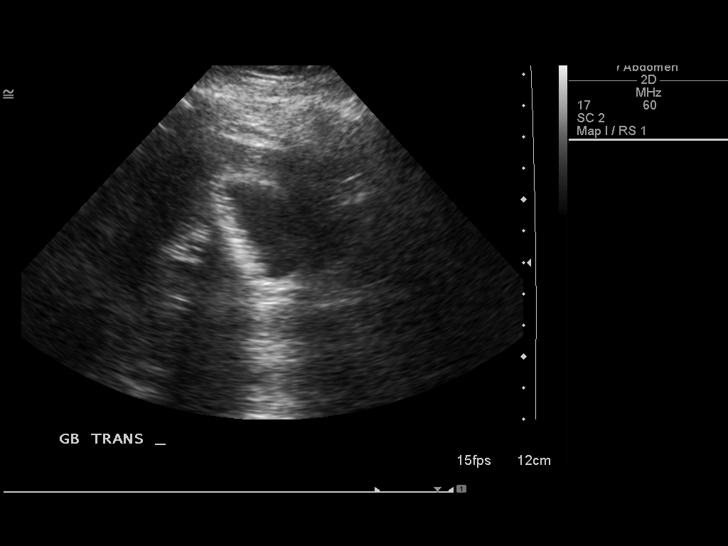
[im 55/83]
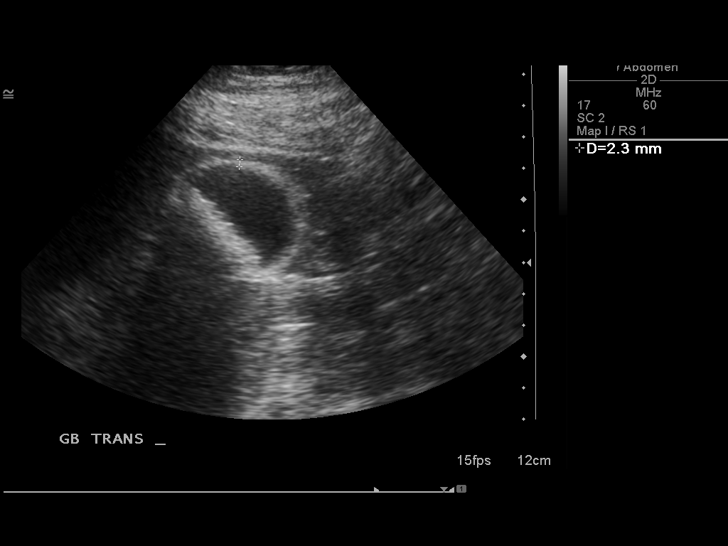
[im 62/83]
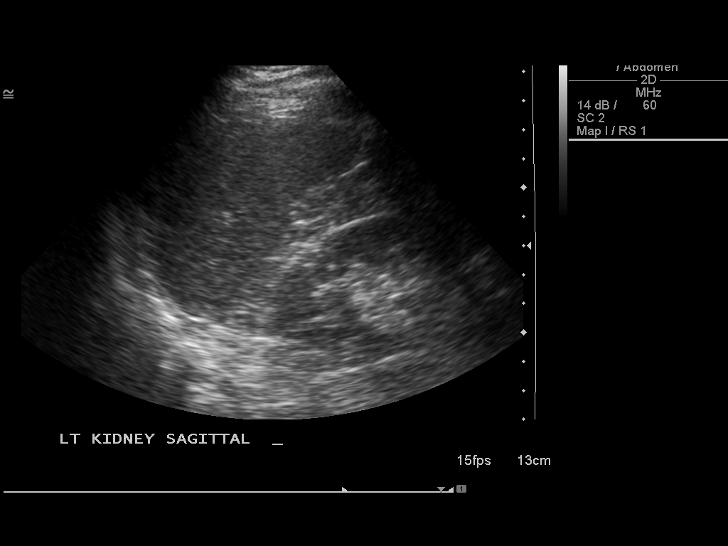
[im 69/83]
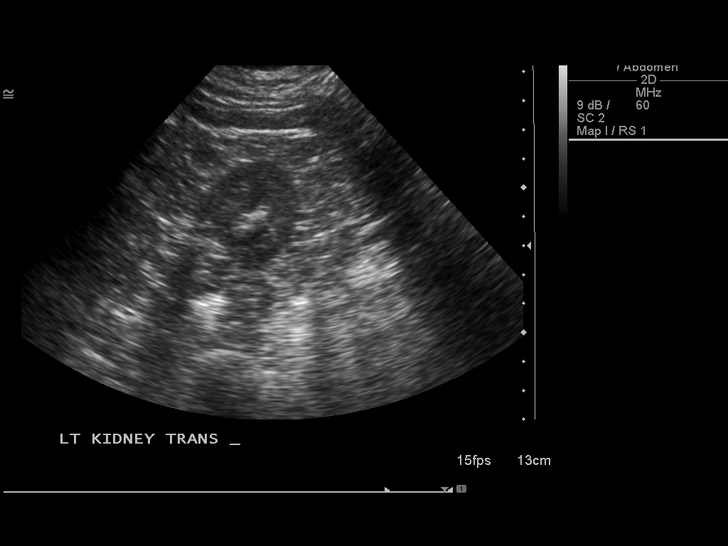
[im 76/83]
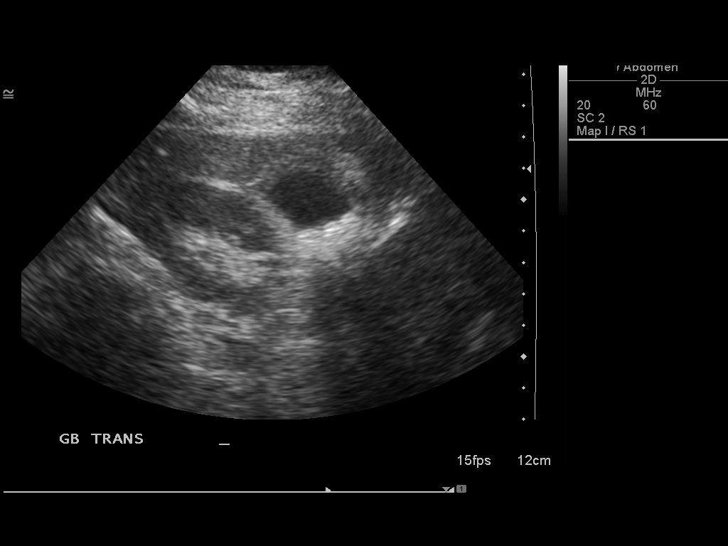
[im 83/83]
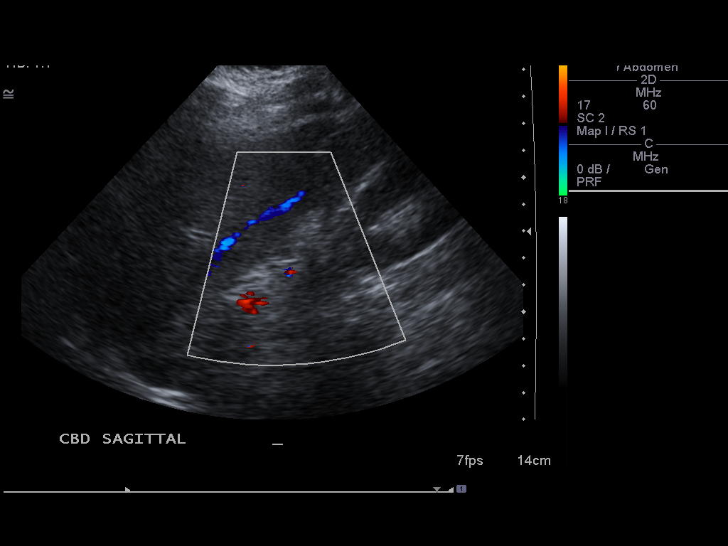

[14 of 25 positions shown; findings below may reference images not displayed]

FINDINGS: Gallbladder:  Numerous layering gallstones.  No gallbladder wall
thickening or pericholecystic fluid.  Negative sonographic Murphy's
sign.

Common bile duct:  Measures 3 mm.

Liver:  No focal lesion identified.  Within normal limits in
parenchymal echogenicity.

IVC:  Appears normal.

Pancreas:  Not visualized due to overlying bowel gas.

Spleen:  Measures 7.9 cm.

Right Kidney:  Measures 10.9 cm.  No mass or hydronephrosis.

Left Kidney:  Measures 10.3 cm.  No mass or hydronephrosis.

Abdominal aorta:  Not well visualized.
IMPRESSION: Cholelithiasis, without associated sonographic findings to suggest
acute cholecystitis.

## 2014-09-07 ENCOUNTER — Ambulatory Visit
Admission: RE | Admit: 2014-09-07 | Discharge: 2014-09-07 | Disposition: A | Discharge status: Home / Self Care | Payer: Medicare Other | Source: Ambulatory Visit | Attending: Family Medicine | Admitting: Family Medicine

## 2014-09-07 DIAGNOSIS — Z780 Asymptomatic menopausal state: Secondary | ICD-10-CM | POA: Diagnosis not present

## 2014-09-07 DIAGNOSIS — Z13820 Encounter for screening for osteoporosis: Secondary | ICD-10-CM | POA: Diagnosis not present

## 2014-09-07 DIAGNOSIS — E2839 Other primary ovarian failure: Secondary | ICD-10-CM

## 2014-09-26 DIAGNOSIS — R42 Dizziness and giddiness: Secondary | ICD-10-CM | POA: Diagnosis not present

## 2014-10-24 DIAGNOSIS — E559 Vitamin D deficiency, unspecified: Secondary | ICD-10-CM | POA: Diagnosis not present

## 2014-10-24 DIAGNOSIS — M84375A Stress fracture, left foot, initial encounter for fracture: Secondary | ICD-10-CM | POA: Diagnosis not present

## 2014-10-24 DIAGNOSIS — M84374A Stress fracture, right foot, initial encounter for fracture: Secondary | ICD-10-CM | POA: Diagnosis not present

## 2014-10-29 DIAGNOSIS — M84375D Stress fracture, left foot, subsequent encounter for fracture with routine healing: Secondary | ICD-10-CM | POA: Diagnosis not present

## 2014-10-29 DIAGNOSIS — E559 Vitamin D deficiency, unspecified: Secondary | ICD-10-CM | POA: Diagnosis not present

## 2014-10-29 DIAGNOSIS — M84374D Stress fracture, right foot, subsequent encounter for fracture with routine healing: Secondary | ICD-10-CM | POA: Diagnosis not present

## 2014-11-12 DIAGNOSIS — M84375D Stress fracture, left foot, subsequent encounter for fracture with routine healing: Secondary | ICD-10-CM | POA: Diagnosis not present

## 2014-11-12 DIAGNOSIS — M84374D Stress fracture, right foot, subsequent encounter for fracture with routine healing: Secondary | ICD-10-CM | POA: Diagnosis not present

## 2014-11-26 DIAGNOSIS — M84375D Stress fracture, left foot, subsequent encounter for fracture with routine healing: Secondary | ICD-10-CM | POA: Diagnosis not present

## 2014-11-26 DIAGNOSIS — M84374D Stress fracture, right foot, subsequent encounter for fracture with routine healing: Secondary | ICD-10-CM | POA: Diagnosis not present

## 2014-12-10 DIAGNOSIS — M84374D Stress fracture, right foot, subsequent encounter for fracture with routine healing: Secondary | ICD-10-CM | POA: Diagnosis not present

## 2014-12-10 DIAGNOSIS — M84375D Stress fracture, left foot, subsequent encounter for fracture with routine healing: Secondary | ICD-10-CM | POA: Diagnosis not present

## 2014-12-24 DIAGNOSIS — M84375D Stress fracture, left foot, subsequent encounter for fracture with routine healing: Secondary | ICD-10-CM | POA: Diagnosis not present

## 2014-12-24 DIAGNOSIS — M84374D Stress fracture, right foot, subsequent encounter for fracture with routine healing: Secondary | ICD-10-CM | POA: Diagnosis not present

## 2014-12-27 DIAGNOSIS — R0602 Shortness of breath: Secondary | ICD-10-CM | POA: Diagnosis not present

## 2014-12-27 DIAGNOSIS — J209 Acute bronchitis, unspecified: Secondary | ICD-10-CM | POA: Diagnosis not present

## 2015-02-19 ENCOUNTER — Other Ambulatory Visit: Payer: Self-pay

## 2015-02-19 DIAGNOSIS — Z1231 Encounter for screening mammogram for malignant neoplasm of breast: Secondary | ICD-10-CM

## 2015-03-29 ENCOUNTER — Ambulatory Visit
Admission: RE | Admit: 2015-03-29 | Discharge: 2015-03-29 | Disposition: A | Discharge status: Home / Self Care | Payer: Medicare Other | Source: Ambulatory Visit

## 2015-03-29 DIAGNOSIS — Z1231 Encounter for screening mammogram for malignant neoplasm of breast: Secondary | ICD-10-CM | POA: Diagnosis not present

## 2015-03-29 DIAGNOSIS — Z853 Personal history of malignant neoplasm of breast: Secondary | ICD-10-CM | POA: Diagnosis not present

## 2015-05-10 DIAGNOSIS — D3131 Benign neoplasm of right choroid: Secondary | ICD-10-CM | POA: Diagnosis not present

## 2015-05-25 DIAGNOSIS — Z23 Encounter for immunization: Secondary | ICD-10-CM | POA: Diagnosis not present

## 2015-07-03 DIAGNOSIS — R938 Abnormal findings on diagnostic imaging of other specified body structures: Secondary | ICD-10-CM | POA: Diagnosis not present

## 2015-07-03 DIAGNOSIS — J189 Pneumonia, unspecified organism: Secondary | ICD-10-CM | POA: Diagnosis not present

## 2015-07-03 DIAGNOSIS — R05 Cough: Secondary | ICD-10-CM | POA: Diagnosis not present

## 2016-02-26 ENCOUNTER — Other Ambulatory Visit: Payer: Self-pay | Admitting: Family Medicine

## 2016-02-26 DIAGNOSIS — Z853 Personal history of malignant neoplasm of breast: Secondary | ICD-10-CM

## 2016-02-26 DIAGNOSIS — Z9889 Other specified postprocedural states: Secondary | ICD-10-CM

## 2016-02-26 DIAGNOSIS — Z1231 Encounter for screening mammogram for malignant neoplasm of breast: Secondary | ICD-10-CM

## 2016-03-30 ENCOUNTER — Ambulatory Visit
Admission: RE | Admit: 2016-03-30 | Discharge: 2016-03-30 | Disposition: A | Discharge status: Home / Self Care | Payer: Medicare Other | Source: Ambulatory Visit | Attending: Family Medicine | Admitting: Family Medicine

## 2016-03-30 DIAGNOSIS — Z853 Personal history of malignant neoplasm of breast: Secondary | ICD-10-CM

## 2016-03-30 DIAGNOSIS — Z9889 Other specified postprocedural states: Secondary | ICD-10-CM

## 2016-03-30 DIAGNOSIS — Z1231 Encounter for screening mammogram for malignant neoplasm of breast: Secondary | ICD-10-CM | POA: Diagnosis not present

## 2016-03-31 DIAGNOSIS — L304 Erythema intertrigo: Secondary | ICD-10-CM | POA: Diagnosis not present

## 2016-03-31 DIAGNOSIS — Z853 Personal history of malignant neoplasm of breast: Secondary | ICD-10-CM | POA: Diagnosis not present

## 2016-03-31 DIAGNOSIS — R7303 Prediabetes: Secondary | ICD-10-CM | POA: Diagnosis not present

## 2016-03-31 DIAGNOSIS — E782 Mixed hyperlipidemia: Secondary | ICD-10-CM | POA: Diagnosis not present

## 2016-03-31 DIAGNOSIS — E6609 Other obesity due to excess calories: Secondary | ICD-10-CM | POA: Diagnosis not present

## 2016-03-31 DIAGNOSIS — L03818 Cellulitis of other sites: Secondary | ICD-10-CM | POA: Diagnosis not present

## 2016-03-31 DIAGNOSIS — Z6833 Body mass index (BMI) 33.0-33.9, adult: Secondary | ICD-10-CM | POA: Diagnosis not present

## 2016-05-09 DIAGNOSIS — Z23 Encounter for immunization: Secondary | ICD-10-CM | POA: Diagnosis not present

## 2016-07-03 DIAGNOSIS — N649 Disorder of breast, unspecified: Secondary | ICD-10-CM | POA: Diagnosis not present

## 2016-07-03 DIAGNOSIS — L989 Disorder of the skin and subcutaneous tissue, unspecified: Secondary | ICD-10-CM | POA: Diagnosis not present

## 2016-07-08 ENCOUNTER — Other Ambulatory Visit: Payer: Self-pay | Admitting: Family Medicine

## 2016-07-08 DIAGNOSIS — N649 Disorder of breast, unspecified: Secondary | ICD-10-CM

## 2016-07-08 DIAGNOSIS — Z853 Personal history of malignant neoplasm of breast: Secondary | ICD-10-CM

## 2016-07-15 ENCOUNTER — Ambulatory Visit
Admission: RE | Admit: 2016-07-15 | Discharge: 2016-07-15 | Disposition: A | Discharge status: Home / Self Care | Payer: Medicare Other | Source: Ambulatory Visit | Attending: Family Medicine | Admitting: Family Medicine

## 2016-07-15 DIAGNOSIS — N6489 Other specified disorders of breast: Secondary | ICD-10-CM | POA: Diagnosis not present

## 2016-07-15 DIAGNOSIS — N649 Disorder of breast, unspecified: Secondary | ICD-10-CM

## 2016-07-15 DIAGNOSIS — Z853 Personal history of malignant neoplasm of breast: Secondary | ICD-10-CM

## 2016-07-15 DIAGNOSIS — R928 Other abnormal and inconclusive findings on diagnostic imaging of breast: Secondary | ICD-10-CM | POA: Diagnosis not present

## 2016-07-21 DIAGNOSIS — L821 Other seborrheic keratosis: Secondary | ICD-10-CM | POA: Diagnosis not present

## 2016-07-21 DIAGNOSIS — L728 Other follicular cysts of the skin and subcutaneous tissue: Secondary | ICD-10-CM | POA: Diagnosis not present

## 2016-07-21 DIAGNOSIS — L820 Inflamed seborrheic keratosis: Secondary | ICD-10-CM | POA: Diagnosis not present

## 2016-07-21 DIAGNOSIS — Z1283 Encounter for screening for malignant neoplasm of skin: Secondary | ICD-10-CM | POA: Diagnosis not present

## 2017-03-17 ENCOUNTER — Other Ambulatory Visit: Payer: Self-pay | Admitting: Family Medicine

## 2017-03-17 DIAGNOSIS — Z1231 Encounter for screening mammogram for malignant neoplasm of breast: Secondary | ICD-10-CM

## 2017-04-01 ENCOUNTER — Ambulatory Visit
Admission: RE | Admit: 2017-04-01 | Discharge: 2017-04-01 | Disposition: A | Discharge status: Home / Self Care | Payer: Medicare Other | Source: Ambulatory Visit | Attending: Family Medicine | Admitting: Family Medicine

## 2017-04-01 DIAGNOSIS — Z1231 Encounter for screening mammogram for malignant neoplasm of breast: Secondary | ICD-10-CM | POA: Diagnosis not present

## 2017-04-01 HISTORY — DX: Personal history of irradiation: Z92.3

## 2017-04-01 HISTORY — DX: Malignant neoplasm of unspecified site of unspecified female breast: C50.919

## 2017-04-01 HISTORY — DX: Personal history of antineoplastic chemotherapy: Z92.21

## 2017-05-14 DIAGNOSIS — Z23 Encounter for immunization: Secondary | ICD-10-CM | POA: Diagnosis not present

## 2017-12-25 DIAGNOSIS — Z859 Personal history of malignant neoplasm, unspecified: Secondary | ICD-10-CM | POA: Diagnosis not present

## 2017-12-25 DIAGNOSIS — S39012A Strain of muscle, fascia and tendon of lower back, initial encounter: Secondary | ICD-10-CM | POA: Diagnosis not present

## 2017-12-25 DIAGNOSIS — D3502 Benign neoplasm of left adrenal gland: Secondary | ICD-10-CM | POA: Diagnosis not present

## 2017-12-25 DIAGNOSIS — K449 Diaphragmatic hernia without obstruction or gangrene: Secondary | ICD-10-CM | POA: Diagnosis not present

## 2017-12-25 DIAGNOSIS — Z7982 Long term (current) use of aspirin: Secondary | ICD-10-CM | POA: Diagnosis not present

## 2017-12-25 DIAGNOSIS — R1084 Generalized abdominal pain: Secondary | ICD-10-CM | POA: Diagnosis not present

## 2017-12-25 DIAGNOSIS — K5790 Diverticulosis of intestine, part unspecified, without perforation or abscess without bleeding: Secondary | ICD-10-CM | POA: Diagnosis not present

## 2017-12-25 DIAGNOSIS — R109 Unspecified abdominal pain: Secondary | ICD-10-CM | POA: Diagnosis not present

## 2018-02-19 DIAGNOSIS — H2513 Age-related nuclear cataract, bilateral: Secondary | ICD-10-CM | POA: Diagnosis not present

## 2018-03-15 ENCOUNTER — Other Ambulatory Visit: Payer: Self-pay | Admitting: Family Medicine

## 2018-03-15 DIAGNOSIS — Z1231 Encounter for screening mammogram for malignant neoplasm of breast: Secondary | ICD-10-CM

## 2018-04-08 ENCOUNTER — Ambulatory Visit
Admission: RE | Admit: 2018-04-08 | Discharge: 2018-04-08 | Disposition: A | Discharge status: Home / Self Care | Payer: Medicare Other | Source: Ambulatory Visit | Attending: Family Medicine | Admitting: Family Medicine

## 2018-04-08 DIAGNOSIS — Z1231 Encounter for screening mammogram for malignant neoplasm of breast: Secondary | ICD-10-CM

## 2018-05-01 DIAGNOSIS — Z23 Encounter for immunization: Secondary | ICD-10-CM | POA: Diagnosis not present

## 2018-08-02 DIAGNOSIS — H2513 Age-related nuclear cataract, bilateral: Secondary | ICD-10-CM | POA: Diagnosis not present

## 2018-08-02 DIAGNOSIS — H40033 Anatomical narrow angle, bilateral: Secondary | ICD-10-CM | POA: Diagnosis not present

## 2018-09-30 DIAGNOSIS — B379 Candidiasis, unspecified: Secondary | ICD-10-CM | POA: Diagnosis not present

## 2018-09-30 DIAGNOSIS — N390 Urinary tract infection, site not specified: Secondary | ICD-10-CM | POA: Diagnosis not present

## 2018-09-30 DIAGNOSIS — N898 Other specified noninflammatory disorders of vagina: Secondary | ICD-10-CM | POA: Diagnosis not present

## 2018-10-27 DIAGNOSIS — R109 Unspecified abdominal pain: Secondary | ICD-10-CM | POA: Diagnosis not present

## 2018-10-27 DIAGNOSIS — Z0000 Encounter for general adult medical examination without abnormal findings: Secondary | ICD-10-CM | POA: Diagnosis not present

## 2018-10-27 DIAGNOSIS — N898 Other specified noninflammatory disorders of vagina: Secondary | ICD-10-CM | POA: Diagnosis not present

## 2018-10-27 DIAGNOSIS — E7800 Pure hypercholesterolemia, unspecified: Secondary | ICD-10-CM | POA: Diagnosis not present

## 2018-10-27 DIAGNOSIS — E559 Vitamin D deficiency, unspecified: Secondary | ICD-10-CM | POA: Diagnosis not present

## 2018-10-27 DIAGNOSIS — R5383 Other fatigue: Secondary | ICD-10-CM | POA: Diagnosis not present

## 2018-10-27 DIAGNOSIS — Z79899 Other long term (current) drug therapy: Secondary | ICD-10-CM | POA: Diagnosis not present

## 2018-11-25 DIAGNOSIS — R300 Dysuria: Secondary | ICD-10-CM | POA: Diagnosis not present

## 2018-11-25 DIAGNOSIS — N898 Other specified noninflammatory disorders of vagina: Secondary | ICD-10-CM | POA: Diagnosis not present

## 2018-11-25 DIAGNOSIS — B372 Candidiasis of skin and nail: Secondary | ICD-10-CM | POA: Diagnosis not present

## 2018-12-06 DIAGNOSIS — R32 Unspecified urinary incontinence: Secondary | ICD-10-CM | POA: Diagnosis not present

## 2018-12-06 DIAGNOSIS — N760 Acute vaginitis: Secondary | ICD-10-CM | POA: Diagnosis not present

## 2018-12-06 DIAGNOSIS — N898 Other specified noninflammatory disorders of vagina: Secondary | ICD-10-CM | POA: Diagnosis not present

## 2018-12-06 DIAGNOSIS — N952 Postmenopausal atrophic vaginitis: Secondary | ICD-10-CM | POA: Diagnosis not present

## 2018-12-06 DIAGNOSIS — M1990 Unspecified osteoarthritis, unspecified site: Secondary | ICD-10-CM | POA: Insufficient documentation

## 2019-01-05 DIAGNOSIS — R32 Unspecified urinary incontinence: Secondary | ICD-10-CM | POA: Insufficient documentation

## 2019-01-05 DIAGNOSIS — N898 Other specified noninflammatory disorders of vagina: Secondary | ICD-10-CM | POA: Diagnosis not present

## 2019-01-05 DIAGNOSIS — C50919 Malignant neoplasm of unspecified site of unspecified female breast: Secondary | ICD-10-CM | POA: Diagnosis not present

## 2019-03-10 ENCOUNTER — Other Ambulatory Visit: Payer: Self-pay | Admitting: Obstetrics and Gynecology

## 2019-03-10 DIAGNOSIS — Z1231 Encounter for screening mammogram for malignant neoplasm of breast: Secondary | ICD-10-CM

## 2019-05-02 ENCOUNTER — Ambulatory Visit
Admission: RE | Admit: 2019-05-02 | Discharge: 2019-05-02 | Disposition: A | Discharge status: Home / Self Care | Payer: Medicare Other | Source: Ambulatory Visit | Attending: Obstetrics and Gynecology | Admitting: Obstetrics and Gynecology

## 2019-05-02 ENCOUNTER — Other Ambulatory Visit: Payer: Self-pay

## 2019-05-02 DIAGNOSIS — Z1231 Encounter for screening mammogram for malignant neoplasm of breast: Secondary | ICD-10-CM

## 2019-05-08 DIAGNOSIS — Z23 Encounter for immunization: Secondary | ICD-10-CM | POA: Diagnosis not present

## 2020-01-11 ENCOUNTER — Other Ambulatory Visit: Payer: Self-pay | Admitting: Internal Medicine

## 2020-01-11 DIAGNOSIS — Z1231 Encounter for screening mammogram for malignant neoplasm of breast: Secondary | ICD-10-CM

## 2020-01-11 DIAGNOSIS — Z13820 Encounter for screening for osteoporosis: Secondary | ICD-10-CM

## 2020-03-06 ENCOUNTER — Other Ambulatory Visit: Payer: Self-pay

## 2020-03-06 ENCOUNTER — Ambulatory Visit
Admission: EM | Admit: 2020-03-06 | Discharge: 2020-03-06 | Disposition: A | Discharge status: Home / Self Care | Payer: Medicare Other | Attending: Emergency Medicine | Admitting: Emergency Medicine

## 2020-03-06 DIAGNOSIS — W57XXXA Bitten or stung by nonvenomous insect and other nonvenomous arthropods, initial encounter: Secondary | ICD-10-CM

## 2020-03-06 DIAGNOSIS — K130 Diseases of lips: Secondary | ICD-10-CM | POA: Diagnosis not present

## 2020-03-06 MED ORDER — TRIAMCINOLONE ACETONIDE 0.5 % EX OINT: 1.0000 "application " | TOPICAL_OINTMENT | Freq: Two times a day (BID) | CUTANEOUS | 0 refills | Status: DC

## 2020-03-06 MED ORDER — METHYLPREDNISOLONE SODIUM SUCC 125 MG IJ SOLR
125.0000 mg | Freq: Once | INTRAMUSCULAR | Status: AC
  Administered 2020-03-06: 125 mg via INTRAMUSCULAR

## 2020-03-06 NOTE — ED Triage Notes (Signed)
Pt c/o rash/dryness around lips x3wks. States has itchy bug bites to bilateral lower legs/behind knees and buttocks x47month. States used home remedies with no relief.

## 2020-03-06 NOTE — ED Provider Notes (Signed)
[name]    CSN: [id number] Arrival date & time: 03/06/20  1625      History   Chief Complaint Chief Complaint  Patient presents with  . Rash    HPI Christina Beasley is a 74 y.o. female presenting for multiple bug bites to bilateral lower extremities.  Has also noted rash/dryness to right side of mouth.  States it did affect her whole mouth, though has been doing Carmex with some relief.  Patient denying fever, arthralgias, myalgias, known tick bites.  States aside from skin lesion she "feels fine".  Has tried numerous OTC medications without relief.    Past Medical History:  Diagnosis Date  . Arthritis   . Back pain, chronic   . Breast cancer ([place]) 2002   left breast  . Cancer ([place])    breast/right  . Hiatal hernia   . History of blood transfusion   . Hyperlipidemia   . Personal history of chemotherapy 2002  . Personal history of radiation therapy 2002  . Pneumonia    hx of - never hopsitalized    Patient Active Problem List   Diagnosis Date Noted  . Gallstones 03/03/2013    Past Surgical History:  Procedure Laterality Date  . ABDOMINAL HYSTERECTOMY    . BREAST CYST EXCISION     removd cyst  . BREAST LUMPECTOMY Left 2002   lymph nodes removed  . CHOLECYSTECTOMY  03/31/2013   Dr Marlou Starks  . CHOLECYSTECTOMY N/A 03/31/2013   Procedure: LAPAROSCOPIC CHOLECYSTECTOMY WITH INTRAOPERATIVE CHOLANGIOGRAM;  Surgeon: Merrie Roof, MD;  Location: [facility];  Service: General;  Laterality: N/A;  . COLON SURGERY     6 in removed   . CYST REMOVAL TRUNK     on left arm  . DILATION AND CURETTAGE OF UTERUS    . LYMPHADENECTOMY  2002  . OVARY SURGERY     ovary was attached to bowel and had to go and separate the 2    OB History   No obstetric history on file.      Home Medications    Prior to Admission medications   Medication Sig Start Date End Date Taking? Authorizing Provider  aspirin 81 MG chewable tablet Chew 81 mg by mouth daily.    [provider]  Multiple Vitamin (MULTIVITAMIN WITH MINERALS) TABS tablet Take 1 tablet by mouth daily.    [provider]  triamcinolone ointment (KENALOG) 0.5 % Apply 1 application topically 2 (two) times daily. 03/06/20   Hall-Potvin, [place], PA-C    Family History Family History  Problem Relation Age of Onset  . Cancer Father        leukemia    Social History Social History   Tobacco Use  . Smoking status: Never Smoker  . Smokeless tobacco: Never Used  Substance Use Topics  . Alcohol use: Yes    Comment: occassional  . Drug use: No     Allergies   Patient has no known allergies.   Review of Systems As per HPI   Physical Exam Triage Vital Signs ED Triage Vitals  Enc Vitals Group     BP 03/06/20 1705 (!) 163/101     Pulse Rate 03/06/20 1705 70     Resp 03/06/20 1705 18     Temp 03/06/20 1705 99.1 F (37.3 C)     Temp Source 03/06/20 1705 Oral     SpO2 03/06/20 1705 96 %     Weight --  Height --      Head Circumference --      Peak Flow --      Pain Score 03/06/20 1706 0     Pain Loc --      Pain Edu? --      Excl. in [place]? --    No data found.  Updated Vital Signs BP (!) 163/101 (BP Location: Left Arm)   Pulse 70   Temp 99.1 F (37.3 C) (Oral)   Resp 18   SpO2 96%   Visual Acuity Right Eye Distance:   Left Eye Distance:   Bilateral Distance:    Right Eye Near:   Left Eye Near:    Bilateral Near:     Physical Exam Constitutional:      General: She is not in acute distress. HENT:     Head: Normocephalic and atraumatic.     Mouth/Throat:     Mouth: Mucous membranes are moist.     Pharynx: Oropharynx is clear.  Eyes:     General: No scleral icterus.    Pupils: Pupils are equal, round, and reactive to light.  Cardiovascular:     Rate and Rhythm: Normal rate.  Pulmonary:     Effort: Pulmonary effort is normal.  Skin:    Coloration: Skin is not jaundiced or pale.     Comments: Cheilitis noted to right side of mouth.  Bug  bites noted to lower extremities bilaterally.  No surrounding erythema, discharge, bleeding.   Neurological:     Mental Status: She is alert and oriented to person, place, and time.      UC Treatments / Results  Labs (all labs ordered are listed, but only abnormal results are displayed) Labs Reviewed - No data to display  EKG   Radiology No results found.  Procedures Procedures (including critical care time)  Medications Ordered in UC Medications  methylPREDNISolone sodium succinate (SOLU-MEDROL) 125 mg/2 mL injection 125 mg (has no administration in time range)    Initial Impression / Assessment and Plan / UC Course  I have reviewed the triage vital signs and the nursing notes.  Pertinent labs & imaging results that were available during my care of the patient were reviewed by me and considered in my medical decision making (see chart for details).     Patient afebrile, nontoxic in office today.  Denies history of diabetes.  Patient is mildly hypertensive: Denying signs/symptoms of endorgan damage as mentioned in HPI, PE.  Will follow up with PCP for further evaluation thereof.  Will treat supportively as outlined below.  Of note, patient has been eating a lot of tomatoes: Mentioned that this is acidic and can worsen oral lesion.  Return precautions discussed, pt verbalized understanding and is agreeable to plan. Final Clinical Impressions(s) / UC Diagnoses   Final diagnoses:  Cheilitis  Bug bite, initial encounter     Discharge Instructions     Keep skin clean - may use gentle soaps without perfumes/dyes. Avoid hot water (showers, baths) as this can further dry out and irritate skin. Pat skin dry as rubbing can irritate and tear skin. Apply a gentle moisturizer 1-2 times daily.    ED Prescriptions    Medication Sig Dispense Auth. Provider   triamcinolone ointment (KENALOG) 0.5 % Apply 1 application topically 2 (two) times daily. 30 g Hall-Potvin, [place], PA-C      PDMP not reviewed this encounter.   Neldon Mc Salix, [place] 03/06/20 1850

## 2020-03-06 NOTE — Discharge Instructions (Addendum)
Keep skin clean - may use gentle soaps without perfumes/dyes. Avoid hot water (showers, baths) as this can further dry out and irritate skin. Pat skin dry as rubbing can irritate and tear skin. Apply a gentle moisturizer 1-2 times daily. 

## 2020-03-07 ENCOUNTER — Telehealth: Payer: Self-pay | Admitting: Emergency Medicine

## 2020-03-07 MED ORDER — TRIAMCINOLONE ACETONIDE 0.5 % EX OINT: 1.0000 "application " | TOPICAL_OINTMENT | Freq: Two times a day (BID) | CUTANEOUS | 0 refills | Status: DC

## 2020-05-02 ENCOUNTER — Ambulatory Visit
Admission: RE | Admit: 2020-05-02 | Discharge: 2020-05-02 | Disposition: A | Discharge status: Home / Self Care | Payer: Medicare Other | Source: Ambulatory Visit | Attending: Internal Medicine | Admitting: Internal Medicine

## 2020-05-02 ENCOUNTER — Other Ambulatory Visit: Payer: Self-pay

## 2020-05-02 DIAGNOSIS — Z13820 Encounter for screening for osteoporosis: Secondary | ICD-10-CM

## 2020-05-02 DIAGNOSIS — Z1231 Encounter for screening mammogram for malignant neoplasm of breast: Secondary | ICD-10-CM

## 2020-12-21 DIAGNOSIS — H40033 Anatomical narrow angle, bilateral: Secondary | ICD-10-CM | POA: Diagnosis not present

## 2020-12-21 DIAGNOSIS — H2513 Age-related nuclear cataract, bilateral: Secondary | ICD-10-CM | POA: Diagnosis not present

## 2021-01-07 DIAGNOSIS — H04123 Dry eye syndrome of bilateral lacrimal glands: Secondary | ICD-10-CM | POA: Diagnosis not present

## 2021-01-07 DIAGNOSIS — Z0000 Encounter for general adult medical examination without abnormal findings: Secondary | ICD-10-CM | POA: Diagnosis not present

## 2021-01-07 DIAGNOSIS — E785 Hyperlipidemia, unspecified: Secondary | ICD-10-CM | POA: Diagnosis not present

## 2021-01-07 DIAGNOSIS — R7309 Other abnormal glucose: Secondary | ICD-10-CM | POA: Diagnosis not present

## 2021-01-07 DIAGNOSIS — E559 Vitamin D deficiency, unspecified: Secondary | ICD-10-CM | POA: Diagnosis not present

## 2021-01-21 DIAGNOSIS — F330 Major depressive disorder, recurrent, mild: Secondary | ICD-10-CM | POA: Diagnosis not present

## 2021-01-21 DIAGNOSIS — M25519 Pain in unspecified shoulder: Secondary | ICD-10-CM | POA: Diagnosis not present

## 2021-01-21 DIAGNOSIS — Z853 Personal history of malignant neoplasm of breast: Secondary | ICD-10-CM | POA: Diagnosis not present

## 2021-01-21 DIAGNOSIS — N1831 Chronic kidney disease, stage 3a: Secondary | ICD-10-CM | POA: Diagnosis not present

## 2021-01-21 DIAGNOSIS — Z0000 Encounter for general adult medical examination without abnormal findings: Secondary | ICD-10-CM | POA: Diagnosis not present

## 2021-01-21 DIAGNOSIS — E785 Hyperlipidemia, unspecified: Secondary | ICD-10-CM | POA: Diagnosis not present

## 2021-01-21 DIAGNOSIS — R7309 Other abnormal glucose: Secondary | ICD-10-CM | POA: Diagnosis not present

## 2021-01-21 DIAGNOSIS — Z1339 Encounter for screening examination for other mental health and behavioral disorders: Secondary | ICD-10-CM | POA: Diagnosis not present

## 2021-01-21 DIAGNOSIS — Z1331 Encounter for screening for depression: Secondary | ICD-10-CM | POA: Diagnosis not present

## 2021-04-28 ENCOUNTER — Other Ambulatory Visit: Payer: Self-pay | Admitting: Internal Medicine

## 2021-04-28 DIAGNOSIS — Z1231 Encounter for screening mammogram for malignant neoplasm of breast: Secondary | ICD-10-CM

## 2021-05-31 DIAGNOSIS — Z23 Encounter for immunization: Secondary | ICD-10-CM | POA: Diagnosis not present

## 2021-06-02 ENCOUNTER — Other Ambulatory Visit: Payer: Self-pay

## 2021-06-02 ENCOUNTER — Ambulatory Visit
Admission: RE | Admit: 2021-06-02 | Discharge: 2021-06-02 | Disposition: A | Discharge status: Home / Self Care | Payer: Medicare Other | Source: Ambulatory Visit | Attending: Internal Medicine | Admitting: Internal Medicine

## 2021-06-02 DIAGNOSIS — Z1231 Encounter for screening mammogram for malignant neoplasm of breast: Secondary | ICD-10-CM | POA: Diagnosis not present

## 2021-06-03 DIAGNOSIS — L538 Other specified erythematous conditions: Secondary | ICD-10-CM | POA: Diagnosis not present

## 2021-06-03 DIAGNOSIS — L820 Inflamed seborrheic keratosis: Secondary | ICD-10-CM | POA: Diagnosis not present

## 2021-06-27 ENCOUNTER — Other Ambulatory Visit (HOSPITAL_BASED_OUTPATIENT_CLINIC_OR_DEPARTMENT_OTHER): Payer: Self-pay

## 2021-06-30 ENCOUNTER — Other Ambulatory Visit (HOSPITAL_BASED_OUTPATIENT_CLINIC_OR_DEPARTMENT_OTHER): Payer: Self-pay

## 2021-06-30 MED ORDER — CLOBETASOL PROPIONATE 0.05 % EX OINT
TOPICAL_OINTMENT | CUTANEOUS | 1 refills | Status: DC
Start: 2021-06-30 — End: 2023-03-11
  Filled 2021-06-30: qty 45, 14d supply, fill #0

## 2021-08-11 DIAGNOSIS — R32 Unspecified urinary incontinence: Secondary | ICD-10-CM | POA: Diagnosis not present

## 2021-08-11 DIAGNOSIS — N952 Postmenopausal atrophic vaginitis: Secondary | ICD-10-CM | POA: Diagnosis not present

## 2021-08-11 DIAGNOSIS — Z01419 Encounter for gynecological examination (general) (routine) without abnormal findings: Secondary | ICD-10-CM | POA: Diagnosis not present

## 2021-08-11 DIAGNOSIS — Z6830 Body mass index (BMI) 30.0-30.9, adult: Secondary | ICD-10-CM | POA: Diagnosis not present

## 2021-11-08 DIAGNOSIS — Z1211 Encounter for screening for malignant neoplasm of colon: Secondary | ICD-10-CM | POA: Diagnosis not present

## 2021-11-08 LAB — COLOGUARD: Cologuard: NEGATIVE

## 2021-11-14 LAB — COLOGUARD: COLOGUARD: NEGATIVE

## 2021-11-14 LAB — EXTERNAL GENERIC LAB PROCEDURE: COLOGUARD: NEGATIVE

## 2022-01-05 ENCOUNTER — Other Ambulatory Visit (HOSPITAL_BASED_OUTPATIENT_CLINIC_OR_DEPARTMENT_OTHER): Payer: Self-pay

## 2022-01-05 DIAGNOSIS — R21 Rash and other nonspecific skin eruption: Secondary | ICD-10-CM | POA: Diagnosis not present

## 2022-01-05 DIAGNOSIS — M159 Polyosteoarthritis, unspecified: Secondary | ICD-10-CM | POA: Diagnosis not present

## 2022-01-05 DIAGNOSIS — G4730 Sleep apnea, unspecified: Secondary | ICD-10-CM | POA: Diagnosis not present

## 2022-01-05 DIAGNOSIS — R7303 Prediabetes: Secondary | ICD-10-CM | POA: Diagnosis not present

## 2022-01-05 DIAGNOSIS — R030 Elevated blood-pressure reading, without diagnosis of hypertension: Secondary | ICD-10-CM | POA: Diagnosis not present

## 2022-01-05 DIAGNOSIS — E663 Overweight: Secondary | ICD-10-CM | POA: Diagnosis not present

## 2022-01-05 DIAGNOSIS — Z853 Personal history of malignant neoplasm of breast: Secondary | ICD-10-CM | POA: Diagnosis not present

## 2022-01-05 DIAGNOSIS — K130 Diseases of lips: Secondary | ICD-10-CM | POA: Diagnosis not present

## 2022-01-05 MED ORDER — MUPIROCIN 2 % EX OINT
TOPICAL_OINTMENT | CUTANEOUS | 1 refills | Status: DC
  Filled 2022-01-05: qty 22, 5d supply, fill #0

## 2022-01-05 MED ORDER — FLUTICASONE PROPIONATE 0.05 % EX CREA
TOPICAL_CREAM | CUTANEOUS | 1 refills | Status: DC
  Filled 2022-01-05: qty 15, 5d supply, fill #0

## 2022-01-05 MED ORDER — CHLORHEXIDINE GLUCONATE 0.12 % MT SOLN
OROMUCOSAL | 1 refills | Status: DC
  Filled 2022-01-05: qty 473, 14d supply, fill #0

## 2022-01-07 ENCOUNTER — Other Ambulatory Visit (HOSPITAL_BASED_OUTPATIENT_CLINIC_OR_DEPARTMENT_OTHER): Payer: Self-pay

## 2022-01-20 ENCOUNTER — Other Ambulatory Visit (HOSPITAL_BASED_OUTPATIENT_CLINIC_OR_DEPARTMENT_OTHER): Payer: Self-pay

## 2022-01-20 DIAGNOSIS — K130 Diseases of lips: Secondary | ICD-10-CM | POA: Diagnosis not present

## 2022-01-20 DIAGNOSIS — R21 Rash and other nonspecific skin eruption: Secondary | ICD-10-CM | POA: Diagnosis not present

## 2022-01-20 DIAGNOSIS — K121 Other forms of stomatitis: Secondary | ICD-10-CM | POA: Diagnosis not present

## 2022-01-20 MED ORDER — DEXAMETHASONE 0.5 MG/5ML PO SOLN
ORAL | 1 refills | Status: DC
  Filled 2022-01-20: qty 280, 14d supply, fill #0

## 2022-01-26 DIAGNOSIS — K130 Diseases of lips: Secondary | ICD-10-CM | POA: Diagnosis not present

## 2022-01-26 DIAGNOSIS — R21 Rash and other nonspecific skin eruption: Secondary | ICD-10-CM | POA: Diagnosis not present

## 2022-01-26 LAB — BASIC METABOLIC PANEL
BUN: 13 (ref 4–21)
CO2: 28 — AB (ref 13–22)
Chloride: 106 (ref 99–108)
Creatinine: 1 (ref 0.5–1.1)
Glucose: 105
Potassium: 4.8 mEq/L (ref 3.5–5.1)
Sodium: 139 (ref 137–147)

## 2022-01-26 LAB — HEPATIC FUNCTION PANEL
ALT: 14 U/L (ref 7–35)
AST: 17 (ref 13–35)
Alkaline Phosphatase: 75 (ref 25–125)
Bilirubin, Total: 0.4

## 2022-01-26 LAB — CBC AND DIFFERENTIAL
HCT: 44 (ref 36–46)
Hemoglobin: 15 (ref 12.0–16.0)
Platelets: 210 10*3/uL (ref 150–400)
WBC: 4.7

## 2022-01-26 LAB — COMPREHENSIVE METABOLIC PANEL
Albumin: 4.1 (ref 3.5–5.0)
Calcium: 9.1 (ref 8.7–10.7)
eGFR: 58

## 2022-02-16 DIAGNOSIS — L989 Disorder of the skin and subcutaneous tissue, unspecified: Secondary | ICD-10-CM | POA: Diagnosis not present

## 2022-03-03 ENCOUNTER — Ambulatory Visit (INDEPENDENT_AMBULATORY_CARE_PROVIDER_SITE_OTHER): Payer: Medicare Other | Admitting: Internal Medicine

## 2022-03-03 ENCOUNTER — Other Ambulatory Visit (HOSPITAL_BASED_OUTPATIENT_CLINIC_OR_DEPARTMENT_OTHER): Payer: Self-pay

## 2022-03-03 ENCOUNTER — Encounter: Payer: Self-pay | Admitting: Internal Medicine

## 2022-03-03 VITALS — BP 136/80 | HR 64 | Temp 98.3°F | Ht 60.0 in | Wt 145.0 lb

## 2022-03-03 DIAGNOSIS — G4730 Sleep apnea, unspecified: Secondary | ICD-10-CM | POA: Insufficient documentation

## 2022-03-03 DIAGNOSIS — B370 Candidal stomatitis: Secondary | ICD-10-CM

## 2022-03-03 DIAGNOSIS — M1991 Primary osteoarthritis, unspecified site: Secondary | ICD-10-CM | POA: Insufficient documentation

## 2022-03-03 DIAGNOSIS — K130 Diseases of lips: Secondary | ICD-10-CM | POA: Insufficient documentation

## 2022-03-03 DIAGNOSIS — K146 Glossodynia: Secondary | ICD-10-CM

## 2022-03-03 DIAGNOSIS — N649 Disorder of breast, unspecified: Secondary | ICD-10-CM | POA: Insufficient documentation

## 2022-03-03 DIAGNOSIS — R7303 Prediabetes: Secondary | ICD-10-CM | POA: Insufficient documentation

## 2022-03-03 MED ORDER — ALPHA-LIPOIC ACID 300 MG PO CAPS
ORAL_CAPSULE | Freq: Every day | ORAL | 1 refills | Status: DC
  Filled 2022-03-03: qty 200, 100d supply, fill #0

## 2022-03-03 MED ORDER — FLUCONAZOLE 100 MG PO TABS
100.0000 mg | ORAL_TABLET | Freq: Every day | ORAL | 0 refills | Status: AC
  Filled 2022-03-03: qty 7, 7d supply, fill #0

## 2022-03-03 NOTE — Patient Instructions (Signed)

## 2022-03-03 NOTE — Progress Notes (Signed)
Subjective:  Patient ID: Christina Beasley, female    DOB: [birth year]  Age: 76 y.o. MRN: [id number]  CC: Follow-up   HPI Christina Beasley presents for establishing --  She jokingly tells me that I am the eighth doctor she has seen recently.  She has recurrent episodes of oral candidiasis and burning mouth syndrome.  She has had recent labs that were negative for secondary causes.  She has been treated with several different options.  She admits that she feels depressed but does not want to take an antidepressant.  She denies SI or HI.  History Christina Beasley has a past medical history of Arthritis, Back pain, chronic, Breast cancer ([place]) (2002), Cancer ([place]), Hiatal hernia, History of blood transfusion, Hyperlipidemia, Personal history of chemotherapy (2002), Personal history of radiation therapy (2002), and Pneumonia.   She has a past surgical history that includes Abdominal hysterectomy; Lymphadenectomy (2002); Cyst removal trunk; Colon surgery; Ovary surgery; Dilation and curettage of uterus; Cholecystectomy (03/31/2013); Cholecystectomy (N/A, 03/31/2013); Breast cyst excision; and Breast lumpectomy (Left, 2002).   Her family history includes Cancer in her father.She reports that she has never smoked. She has never used smokeless tobacco. She reports current alcohol use. She reports that she does not use drugs.  Outpatient Medications Prior to Visit  Medication Sig Dispense Refill   aspirin 81 MG chewable tablet Chew 81 mg by mouth daily.     chlorhexidine (PERIDEX) 0.12 % solution rinse and swish in mouth for 30 seconds then spit out and rinse mouth with water Mouth/Throat twice a day 14 days 473 mL 1   clobetasol ointment (TEMOVATE) 0.05 % APPLY A THIN LAYER TO THE AFFECTED AREA(S) BY TOPICAL ROUTE once every night x 2 weeks and then space it out to twice a week therafter. 45 g 1   dexamethasone (DECADRON) 0.5 MG/5ML solution 10 mL Orally, swish for 30 seconds to 1 min then spit  and rinse with water  twice a day 14 days 280 mL 1   fluticasone (CUTIVATE) 9.48 % cream 1 application Externally Twice a day 14 day(s) 60 g 1   Multiple Vitamin (MULTIVITAMIN WITH MINERALS) TABS tablet Take 1 tablet by mouth daily.     mupirocin ointment (BACTROBAN) 2 % 1 application Externally Twice a day 14 days 66 g 1   triamcinolone ointment (KENALOG) 0.5 % Apply 1 application topically 2 (two) times daily. 30 g 0   No facility-administered medications prior to visit.    ROS Review of Systems  Constitutional:  Negative for chills, diaphoresis, fatigue and fever.  HENT: Negative.    Eyes: Negative.   Respiratory: Negative.  Negative for cough, chest tightness, shortness of breath and wheezing.   Cardiovascular:  Negative for chest pain, palpitations and leg swelling.  Gastrointestinal:  Negative for abdominal pain, constipation, diarrhea, nausea and vomiting.  Genitourinary:  Negative for difficulty urinating.  Musculoskeletal: Negative.  Negative for arthralgias and myalgias.  Skin:  Negative for rash.  Neurological:  Negative for dizziness and weakness.  Hematological:  Negative for adenopathy. Does not bruise/bleed easily.  Psychiatric/Behavioral:  Positive for dysphoric mood. Negative for self-injury, sleep disturbance and suicidal ideas. The patient is not nervous/anxious.     Objective:  BP 136/80 (BP Location: Right Arm, Patient Position: Sitting, Cuff Size: Large)   Pulse 64   Temp 98.3 F (36.8 C) (Oral)   Ht 5' (1.524 m)   Wt 145 lb (65.8 kg)   SpO2 96%   BMI 28.32 kg/m  Physical Exam Vitals reviewed.  HENT:     Nose: Nose normal.     Mouth/Throat:     Pharynx: No oropharyngeal exudate or posterior oropharyngeal erythema.     Comments: Tongue is diffusely erythematous Eyes:     General: No scleral icterus.    Conjunctiva/sclera: Conjunctivae normal.  Cardiovascular:     Rate and Rhythm: Normal rate and regular rhythm.     Heart sounds: No murmur heard. Pulmonary:      Effort: Pulmonary effort is normal.     Breath sounds: No stridor. No wheezing, rhonchi or rales.  Abdominal:     Palpations: There is no mass.     Tenderness: There is no abdominal tenderness. There is no guarding.     Hernia: No hernia is present.  Musculoskeletal:        General: Normal range of motion.     Cervical back: Neck supple.     Right lower leg: No edema.     Left lower leg: No edema.  Lymphadenopathy:     Cervical: No cervical adenopathy.  Skin:    General: Skin is warm and dry.  Neurological:     General: No focal deficit present.     Mental Status: She is alert.  Psychiatric:        Mood and Affect: Mood normal.        Behavior: Behavior normal.     Lab Results  Component Value Date   WBC 4.7 01/26/2022   HGB 15.0 01/26/2022   HCT 44 01/26/2022   PLT 210 01/26/2022   GLUCOSE 95 03/23/2013   ALT 14 01/26/2022   AST 17 01/26/2022   NA 139 01/26/2022   K 4.8 01/26/2022   CL 106 01/26/2022   CREATININE 1.0 01/26/2022   BUN 13 01/26/2022   CO2 28 (A) 01/26/2022     Assessment & Plan:   Christina Beasley was seen today for follow-up.  Diagnoses and all orders for this visit:  Burning mouth syndrome -     Alpha-Lipoic Acid 300 MG CAPS; Take 2 capsules by mouth once daily.  Thrush, oral -     fluconazole (DIFLUCAN) 100 MG tablet; Take 1 tablet (100 mg total) by mouth daily for 7 days.   I am having Christina Beasley. Christina Beasley start on Alpha-Lipoic Acid and fluconazole. I am also having her maintain her aspirin, multivitamin with minerals, triamcinolone ointment, clobetasol ointment, chlorhexidine, fluticasone, mupirocin ointment, and dexamethasone.  Meds ordered this encounter  Medications   Alpha-Lipoic Acid 300 MG CAPS    Sig: Take 2 capsules by mouth once daily.    Dispense:  200 capsule    Refill:  1   fluconazole (DIFLUCAN) 100 MG tablet    Sig: Take 1 tablet (100 mg total) by mouth daily for 7 days.    Dispense:  7 tablet    Refill:  0     Follow-up: Return  if symptoms worsen or fail to improve.  Scarlette Calico, MD

## 2022-03-08 ENCOUNTER — Encounter: Payer: Self-pay | Admitting: Internal Medicine

## 2022-03-10 DIAGNOSIS — L439 Lichen planus, unspecified: Secondary | ICD-10-CM | POA: Diagnosis not present

## 2022-03-10 DIAGNOSIS — D485 Neoplasm of uncertain behavior of skin: Secondary | ICD-10-CM | POA: Diagnosis not present

## 2022-03-23 DIAGNOSIS — K1379 Other lesions of oral mucosa: Secondary | ICD-10-CM | POA: Diagnosis not present

## 2022-03-23 DIAGNOSIS — L439 Lichen planus, unspecified: Secondary | ICD-10-CM | POA: Diagnosis not present

## 2022-03-31 DIAGNOSIS — Z789 Other specified health status: Secondary | ICD-10-CM | POA: Diagnosis not present

## 2022-03-31 DIAGNOSIS — L820 Inflamed seborrheic keratosis: Secondary | ICD-10-CM | POA: Diagnosis not present

## 2022-03-31 DIAGNOSIS — L298 Other pruritus: Secondary | ICD-10-CM | POA: Diagnosis not present

## 2022-03-31 DIAGNOSIS — L280 Lichen simplex chronicus: Secondary | ICD-10-CM | POA: Diagnosis not present

## 2022-03-31 DIAGNOSIS — L538 Other specified erythematous conditions: Secondary | ICD-10-CM | POA: Diagnosis not present

## 2022-04-23 ENCOUNTER — Other Ambulatory Visit: Payer: Self-pay | Admitting: Internal Medicine

## 2022-04-23 DIAGNOSIS — Z1231 Encounter for screening mammogram for malignant neoplasm of breast: Secondary | ICD-10-CM

## 2022-04-27 ENCOUNTER — Other Ambulatory Visit (HOSPITAL_BASED_OUTPATIENT_CLINIC_OR_DEPARTMENT_OTHER): Payer: Self-pay

## 2022-04-27 MED ORDER — FLUOCINONIDE 0.05 % EX GEL
CUTANEOUS | 0 refills | Status: DC
  Filled 2022-04-27: qty 15, 30d supply, fill #0

## 2022-04-28 ENCOUNTER — Other Ambulatory Visit (HOSPITAL_BASED_OUTPATIENT_CLINIC_OR_DEPARTMENT_OTHER): Payer: Self-pay

## 2022-04-29 ENCOUNTER — Other Ambulatory Visit (HOSPITAL_BASED_OUTPATIENT_CLINIC_OR_DEPARTMENT_OTHER): Payer: Self-pay

## 2022-05-01 ENCOUNTER — Other Ambulatory Visit (HOSPITAL_BASED_OUTPATIENT_CLINIC_OR_DEPARTMENT_OTHER): Payer: Self-pay

## 2022-05-05 ENCOUNTER — Telehealth: Payer: Self-pay | Admitting: Internal Medicine

## 2022-05-05 NOTE — Telephone Encounter (Signed)
Called patient to schedule AWV with [facility]. Patient states she in now seeing another PCP. Message sent to Dr.Jones.

## 2022-06-04 ENCOUNTER — Ambulatory Visit: Payer: Medicare Other

## 2022-06-12 DIAGNOSIS — L280 Lichen simplex chronicus: Secondary | ICD-10-CM | POA: Diagnosis not present

## 2022-06-12 DIAGNOSIS — K1239 Other oral mucositis (ulcerative): Secondary | ICD-10-CM | POA: Diagnosis not present

## 2022-07-09 ENCOUNTER — Other Ambulatory Visit (HOSPITAL_BASED_OUTPATIENT_CLINIC_OR_DEPARTMENT_OTHER): Payer: Self-pay

## 2022-07-09 MED ORDER — LIDOCAINE VISCOUS HCL 2 % MT SOLN
15.0000 mL | Freq: Four times a day (QID) | OROMUCOSAL | 0 refills | Status: DC | PRN
  Filled 2022-07-09 – 2022-07-14 (×2): qty 150, 3d supply, fill #0

## 2022-07-09 MED ORDER — FLUOCINONIDE 0.05 % EX GEL
CUTANEOUS | 0 refills | Status: DC
  Filled 2022-07-09: qty 15, 30d supply, fill #0

## 2022-07-10 ENCOUNTER — Other Ambulatory Visit (HOSPITAL_BASED_OUTPATIENT_CLINIC_OR_DEPARTMENT_OTHER): Payer: Self-pay

## 2022-07-12 ENCOUNTER — Other Ambulatory Visit (HOSPITAL_BASED_OUTPATIENT_CLINIC_OR_DEPARTMENT_OTHER): Payer: Self-pay

## 2022-07-13 ENCOUNTER — Other Ambulatory Visit (HOSPITAL_BASED_OUTPATIENT_CLINIC_OR_DEPARTMENT_OTHER): Payer: Self-pay

## 2022-07-14 ENCOUNTER — Other Ambulatory Visit (HOSPITAL_BASED_OUTPATIENT_CLINIC_OR_DEPARTMENT_OTHER): Payer: Self-pay

## 2022-07-15 ENCOUNTER — Other Ambulatory Visit (HOSPITAL_BASED_OUTPATIENT_CLINIC_OR_DEPARTMENT_OTHER): Payer: Self-pay

## 2022-07-20 ENCOUNTER — Other Ambulatory Visit (HOSPITAL_BASED_OUTPATIENT_CLINIC_OR_DEPARTMENT_OTHER): Payer: Self-pay

## 2022-08-06 ENCOUNTER — Ambulatory Visit
Admission: RE | Admit: 2022-08-06 | Discharge: 2022-08-06 | Disposition: A | Discharge status: Home / Self Care | Payer: Medicare Other | Source: Ambulatory Visit | Attending: Internal Medicine | Admitting: Internal Medicine

## 2022-08-06 DIAGNOSIS — Z1231 Encounter for screening mammogram for malignant neoplasm of breast: Secondary | ICD-10-CM

## 2022-08-28 DIAGNOSIS — Z6828 Body mass index (BMI) 28.0-28.9, adult: Secondary | ICD-10-CM | POA: Diagnosis not present

## 2022-08-28 DIAGNOSIS — L439 Lichen planus, unspecified: Secondary | ICD-10-CM | POA: Diagnosis not present

## 2022-08-28 DIAGNOSIS — R32 Unspecified urinary incontinence: Secondary | ICD-10-CM | POA: Diagnosis not present

## 2022-08-28 DIAGNOSIS — N952 Postmenopausal atrophic vaginitis: Secondary | ICD-10-CM | POA: Diagnosis not present

## 2022-08-28 DIAGNOSIS — Z01419 Encounter for gynecological examination (general) (routine) without abnormal findings: Secondary | ICD-10-CM | POA: Diagnosis not present

## 2022-09-18 ENCOUNTER — Ambulatory Visit
Admission: RE | Admit: 2022-09-18 | Discharge: 2022-09-18 | Disposition: A | Discharge status: Home / Self Care | Payer: Medicare Other | Source: Ambulatory Visit | Attending: Internal Medicine | Admitting: Internal Medicine

## 2022-09-18 ENCOUNTER — Other Ambulatory Visit: Payer: Self-pay | Admitting: Internal Medicine

## 2022-09-18 DIAGNOSIS — M159 Polyosteoarthritis, unspecified: Secondary | ICD-10-CM | POA: Diagnosis not present

## 2022-09-18 DIAGNOSIS — L439 Lichen planus, unspecified: Secondary | ICD-10-CM | POA: Diagnosis not present

## 2022-09-18 DIAGNOSIS — G4730 Sleep apnea, unspecified: Secondary | ICD-10-CM | POA: Diagnosis not present

## 2022-09-18 DIAGNOSIS — Z1331 Encounter for screening for depression: Secondary | ICD-10-CM | POA: Diagnosis not present

## 2022-09-18 DIAGNOSIS — R7303 Prediabetes: Secondary | ICD-10-CM | POA: Diagnosis not present

## 2022-09-18 DIAGNOSIS — Z136 Encounter for screening for cardiovascular disorders: Secondary | ICD-10-CM | POA: Diagnosis not present

## 2022-09-18 DIAGNOSIS — R030 Elevated blood-pressure reading, without diagnosis of hypertension: Secondary | ICD-10-CM | POA: Diagnosis not present

## 2022-09-18 DIAGNOSIS — E663 Overweight: Secondary | ICD-10-CM | POA: Diagnosis not present

## 2022-09-18 DIAGNOSIS — M25511 Pain in right shoulder: Secondary | ICD-10-CM

## 2022-09-18 DIAGNOSIS — G8929 Other chronic pain: Secondary | ICD-10-CM | POA: Diagnosis not present

## 2022-09-18 DIAGNOSIS — Z0000 Encounter for general adult medical examination without abnormal findings: Secondary | ICD-10-CM | POA: Diagnosis not present

## 2022-09-18 DIAGNOSIS — M79604 Pain in right leg: Secondary | ICD-10-CM | POA: Diagnosis not present

## 2022-09-18 DIAGNOSIS — K1239 Other oral mucositis (ulcerative): Secondary | ICD-10-CM | POA: Diagnosis not present

## 2022-09-18 DIAGNOSIS — Z13220 Encounter for screening for lipoid disorders: Secondary | ICD-10-CM | POA: Diagnosis not present

## 2022-10-08 DIAGNOSIS — K1239 Other oral mucositis (ulcerative): Secondary | ICD-10-CM | POA: Diagnosis not present

## 2022-10-08 DIAGNOSIS — L729 Follicular cyst of the skin and subcutaneous tissue, unspecified: Secondary | ICD-10-CM | POA: Diagnosis not present

## 2022-10-08 DIAGNOSIS — L439 Lichen planus, unspecified: Secondary | ICD-10-CM | POA: Diagnosis not present

## 2022-10-08 DIAGNOSIS — M79604 Pain in right leg: Secondary | ICD-10-CM | POA: Diagnosis not present

## 2022-12-24 DIAGNOSIS — H35372 Puckering of macula, left eye: Secondary | ICD-10-CM | POA: Diagnosis not present

## 2022-12-24 DIAGNOSIS — H2513 Age-related nuclear cataract, bilateral: Secondary | ICD-10-CM | POA: Diagnosis not present

## 2022-12-24 DIAGNOSIS — H52203 Unspecified astigmatism, bilateral: Secondary | ICD-10-CM | POA: Diagnosis not present

## 2022-12-24 DIAGNOSIS — D3131 Benign neoplasm of right choroid: Secondary | ICD-10-CM | POA: Diagnosis not present

## 2022-12-31 ENCOUNTER — Encounter: Payer: Self-pay | Admitting: Podiatry

## 2022-12-31 ENCOUNTER — Ambulatory Visit (INDEPENDENT_AMBULATORY_CARE_PROVIDER_SITE_OTHER): Payer: Medicare Other

## 2022-12-31 ENCOUNTER — Ambulatory Visit (INDEPENDENT_AMBULATORY_CARE_PROVIDER_SITE_OTHER): Payer: Medicare Other | Admitting: Podiatry

## 2022-12-31 DIAGNOSIS — M778 Other enthesopathies, not elsewhere classified: Secondary | ICD-10-CM

## 2022-12-31 DIAGNOSIS — M19079 Primary osteoarthritis, unspecified ankle and foot: Secondary | ICD-10-CM

## 2023-01-02 NOTE — Progress Notes (Signed)
Subjective:  Patient ID: Christina Beasley, female    DOB: 1946/05/02,  MRN: [id number] HPI Chief Complaint  Patient presents with   Toe Pain    2nd, 3rd, 4th toes bilateral - broke toes in 2016, seen Dr. Elijah Birk, wore a boot on the worst foot, feels like neither of them healed correctly-she is still having pain and the toes are curved, when walking feet turn out, also has back issues, used to be a dancer and danced in 2" heels, tried some Good Feet orthotics but feet worsened after wearing them   New Patient (Initial Visit)    77 y.o. female presents with the above complaint.   ROS: Denies fever chills nausea vomit muscle aches pains calf pain back pain chest pain shortness of breath.  Past Medical History:  Diagnosis Date   Arthritis    Back pain, chronic    Breast cancer ([facility]) 2002   left breast   Cancer ([facility])    breast/right   Hiatal hernia    History of blood transfusion    Hyperlipidemia    Personal history of chemotherapy 2002   Personal history of radiation therapy 2002   Pneumonia    hx of - never hopsitalized   Past Surgical History:  Procedure Laterality Date   ABDOMINAL HYSTERECTOMY     BREAST CYST EXCISION     removd cyst   BREAST LUMPECTOMY Left 2002   lymph nodes removed   CHOLECYSTECTOMY  03/31/2013   Dr Carolynne Edouard   CHOLECYSTECTOMY N/A 03/31/2013   Procedure: LAPAROSCOPIC CHOLECYSTECTOMY WITH INTRAOPERATIVE CHOLANGIOGRAM;  Surgeon: Caleen Essex III, MD;  Location: [facility] OR;  Service: General;  Laterality: N/A;   COLON SURGERY     6 in removed    CYST REMOVAL TRUNK     on left arm   DILATION AND CURETTAGE OF UTERUS     LYMPHADENECTOMY  2002   OVARY SURGERY     ovary was attached to bowel and had to go and separate the 2    Current Outpatient Medications:    Alpha-Lipoic Acid 300 MG CAPS, Take 2 capsules by mouth once daily., Disp: 200 capsule, Rfl: 1   aspirin 81 MG chewable tablet, Chew 81 mg by mouth daily., Disp: , Rfl:    chlorhexidine (PERIDEX) 0.12 %  solution, rinse and swish in mouth for 30 seconds then spit out and rinse mouth with water Mouth/Throat twice a day 14 days, Disp: 473 mL, Rfl: 1   clobetasol ointment (TEMOVATE) 0.05 %, APPLY A THIN LAYER TO THE AFFECTED AREA(S) BY TOPICAL ROUTE once every night x 2 weeks and then space it out to twice a week therafter., Disp: 45 g, Rfl: 1   dexamethasone (DECADRON) 0.5 MG/5ML solution, 10 mL Orally, swish for 30 seconds to 1 min then spit  and rinse with water twice a day 14 days, Disp: 280 mL, Rfl: 1   fluocinonide gel (LIDEX) 0.05 %, Apply to affected area 4-5 times daily., Disp: 15 g, Rfl: 0   fluticasone (CUTIVATE) 0.05 % cream, 1 application Externally Twice a day 14 day(s), Disp: 60 g, Rfl: 1   magic mouthwash (lidocaine, diphenhydrAMINE, alum & mag hydroxide) suspension, Swish and spit 15 mLs every 6 (six) hours as needed for pain, Disp: 150 mL, Rfl: 0   Multiple Vitamin (MULTIVITAMIN WITH MINERALS) TABS tablet, Take 1 tablet by mouth daily., Disp: , Rfl:    mupirocin ointment (BACTROBAN) 2 %, 1 application Externally Twice a day 14 days, Disp: 66  g, Rfl: 1   triamcinolone ointment (KENALOG) 0.5 %, Apply 1 application topically 2 (two) times daily., Disp: 30 g, Rfl: 0  No Known Allergies Review of Systems Objective:  There were no vitals filed for this visit.  General: Well developed, nourished, in no acute distress, alert and oriented x3   Dermatological: Skin is warm, dry and supple bilateral. Nails x 10 are well maintained; remaining integument appears unremarkable at this time. There are no open sores, no preulcerative lesions, no rash or signs of infection present.  Vascular: Dorsalis Pedis artery and Posterior Tibial artery pedal pulses are 2/4 bilateral with immedate capillary fill time. Pedal hair growth present. No varicosities and no lower extremity edema present bilateral.   Neruologic: Grossly intact via light touch bilateral. Vibratory intact via tuning fork bilateral.  Protective threshold with Semmes Wienstein monofilament intact to all pedal sites bilateral. Patellar and Achilles deep tendon reflexes 2+ bilateral. No Babinski or clonus noted bilateral.   Musculoskeletal: No gross boney pedal deformities bilateral. No pain, crepitus, or limitation noted with foot and ankle range of motion bilateral. Muscular strength 5/5 in all groups tested bilateral.  Pain on palpation of the sinus tarsi and attempted range of motion of the subtalar joints bilaterally.  Significant rigid deformities of the toes including hammertoe deformities swan-neck deformities mallet toe deformities.  Gait: Unassisted, Nonantalgic.    Radiographs:  Radiographs taken today demonstrate an osseously mature individual with significant demineralization of the bones.  She does demonstrate no signs of digital fractures however she demonstrates significant osteoarthritic changes rigid deformities of the toes 2 3 and 4 bilaterally.  She also has osteoarthritic changes of the dorsal midfoot and significant rear foot osteoarthritic changes subchondral sclerosis eburnation joint space narrowing.  Assessment & Plan:   Assessment: Hammertoe deformities severe rigid osteoarthritic changes subtalar joint midfoot and forefoot  Plan: Discussed etiology pathology conservative surgical therapies at this point we discussed topical therapies and oral therapies she does not want to proceed with any of this she is going to try what she has at home should this become more symptomatic she will notify us immediately.  Thanking [place] today for the diagnosis.     Leeroy Lovings T. [place], [place]

## 2023-01-25 DIAGNOSIS — H25812 Combined forms of age-related cataract, left eye: Secondary | ICD-10-CM | POA: Diagnosis not present

## 2023-01-25 DIAGNOSIS — H2512 Age-related nuclear cataract, left eye: Secondary | ICD-10-CM | POA: Diagnosis not present

## 2023-01-25 DIAGNOSIS — Z961 Presence of intraocular lens: Secondary | ICD-10-CM | POA: Diagnosis not present

## 2023-02-18 ENCOUNTER — Ambulatory Visit (HOSPITAL_COMMUNITY)
Admission: RE | Admit: 2023-02-18 | Discharge: 2023-02-18 | Disposition: A | Discharge status: Home / Self Care | Payer: Medicare Other | Source: Ambulatory Visit | Attending: Internal Medicine | Admitting: Internal Medicine

## 2023-02-18 ENCOUNTER — Ambulatory Visit
Admission: RE | Admit: 2023-02-18 | Discharge: 2023-02-18 | Disposition: A | Discharge status: Home / Self Care | Payer: Medicare Other | Source: Ambulatory Visit | Attending: Internal Medicine | Admitting: Internal Medicine

## 2023-02-18 ENCOUNTER — Other Ambulatory Visit (HOSPITAL_COMMUNITY): Payer: Self-pay | Admitting: Internal Medicine

## 2023-02-18 ENCOUNTER — Other Ambulatory Visit: Payer: Self-pay | Admitting: Internal Medicine

## 2023-02-18 ENCOUNTER — Other Ambulatory Visit: Payer: Medicare Other

## 2023-02-18 ENCOUNTER — Other Ambulatory Visit: Payer: Self-pay

## 2023-02-18 DIAGNOSIS — M7989 Other specified soft tissue disorders: Secondary | ICD-10-CM | POA: Insufficient documentation

## 2023-02-18 DIAGNOSIS — M25572 Pain in left ankle and joints of left foot: Secondary | ICD-10-CM

## 2023-02-18 DIAGNOSIS — M79605 Pain in left leg: Secondary | ICD-10-CM | POA: Insufficient documentation

## 2023-02-18 DIAGNOSIS — R52 Pain, unspecified: Secondary | ICD-10-CM

## 2023-02-18 DIAGNOSIS — M79672 Pain in left foot: Secondary | ICD-10-CM | POA: Diagnosis not present

## 2023-02-18 DIAGNOSIS — R600 Localized edema: Secondary | ICD-10-CM | POA: Diagnosis not present

## 2023-02-18 NOTE — Progress Notes (Signed)
VASCULAR LAB    Left lower extremity venous duplex has been performed.  See CV proc for preliminary results.  Left voicemail with negative results.  Rashanda Magloire, RVT 02/18/2023, 4:13 PM

## 2023-02-19 DIAGNOSIS — M7989 Other specified soft tissue disorders: Secondary | ICD-10-CM | POA: Diagnosis not present

## 2023-02-19 DIAGNOSIS — M79662 Pain in left lower leg: Secondary | ICD-10-CM | POA: Diagnosis not present

## 2023-02-19 DIAGNOSIS — M1712 Unilateral primary osteoarthritis, left knee: Secondary | ICD-10-CM | POA: Diagnosis not present

## 2023-02-19 DIAGNOSIS — M7732 Calcaneal spur, left foot: Secondary | ICD-10-CM | POA: Diagnosis not present

## 2023-03-10 ENCOUNTER — Other Ambulatory Visit: Payer: Self-pay

## 2023-03-10 ENCOUNTER — Encounter (HOSPITAL_BASED_OUTPATIENT_CLINIC_OR_DEPARTMENT_OTHER): Payer: Self-pay | Admitting: Emergency Medicine

## 2023-03-10 ENCOUNTER — Emergency Department (HOSPITAL_BASED_OUTPATIENT_CLINIC_OR_DEPARTMENT_OTHER): Payer: Medicare Other | Admitting: Radiology

## 2023-03-10 ENCOUNTER — Encounter (HOSPITAL_COMMUNITY): Payer: Self-pay

## 2023-03-10 ENCOUNTER — Emergency Department (HOSPITAL_BASED_OUTPATIENT_CLINIC_OR_DEPARTMENT_OTHER): Payer: Medicare Other

## 2023-03-10 ENCOUNTER — Emergency Department (HOSPITAL_BASED_OUTPATIENT_CLINIC_OR_DEPARTMENT_OTHER)
Admission: EM | Admit: 2023-03-10 | Discharge: 2023-03-10 | Discharge status: Against Medical Advice | Payer: Medicare Other | Attending: Emergency Medicine | Admitting: Emergency Medicine

## 2023-03-10 ENCOUNTER — Emergency Department (HOSPITAL_COMMUNITY)
Admission: EM | Admit: 2023-03-10 | Discharge: 2023-03-11 | Disposition: A | Discharge status: Home / Self Care | Payer: Medicare Other | Source: Home / Self Care | Attending: Emergency Medicine | Admitting: Emergency Medicine

## 2023-03-10 DIAGNOSIS — Z7982 Long term (current) use of aspirin: Secondary | ICD-10-CM | POA: Insufficient documentation

## 2023-03-10 DIAGNOSIS — M545 Low back pain, unspecified: Secondary | ICD-10-CM | POA: Insufficient documentation

## 2023-03-10 DIAGNOSIS — Z853 Personal history of malignant neoplasm of breast: Secondary | ICD-10-CM | POA: Insufficient documentation

## 2023-03-10 DIAGNOSIS — D3502 Benign neoplasm of left adrenal gland: Secondary | ICD-10-CM | POA: Diagnosis not present

## 2023-03-10 DIAGNOSIS — I700 Atherosclerosis of aorta: Secondary | ICD-10-CM | POA: Diagnosis not present

## 2023-03-10 DIAGNOSIS — M47817 Spondylosis without myelopathy or radiculopathy, lumbosacral region: Secondary | ICD-10-CM | POA: Diagnosis not present

## 2023-03-10 DIAGNOSIS — K449 Diaphragmatic hernia without obstruction or gangrene: Secondary | ICD-10-CM | POA: Diagnosis not present

## 2023-03-10 DIAGNOSIS — M549 Dorsalgia, unspecified: Secondary | ICD-10-CM | POA: Diagnosis not present

## 2023-03-10 DIAGNOSIS — M1610 Unilateral primary osteoarthritis, unspecified hip: Secondary | ICD-10-CM | POA: Diagnosis not present

## 2023-03-10 DIAGNOSIS — K5730 Diverticulosis of large intestine without perforation or abscess without bleeding: Secondary | ICD-10-CM | POA: Insufficient documentation

## 2023-03-10 DIAGNOSIS — M5459 Other low back pain: Secondary | ICD-10-CM | POA: Diagnosis not present

## 2023-03-10 DIAGNOSIS — I2510 Atherosclerotic heart disease of native coronary artery without angina pectoris: Secondary | ICD-10-CM | POA: Diagnosis not present

## 2023-03-10 DIAGNOSIS — R109 Unspecified abdominal pain: Secondary | ICD-10-CM | POA: Diagnosis not present

## 2023-03-10 DIAGNOSIS — M5136 Other intervertebral disc degeneration, lumbar region: Secondary | ICD-10-CM | POA: Diagnosis not present

## 2023-03-10 DIAGNOSIS — M4184 Other forms of scoliosis, thoracic region: Secondary | ICD-10-CM | POA: Diagnosis not present

## 2023-03-10 DIAGNOSIS — K429 Umbilical hernia without obstruction or gangrene: Secondary | ICD-10-CM | POA: Diagnosis not present

## 2023-03-10 HISTORY — DX: Lichen simplex chronicus: L28.0

## 2023-03-10 LAB — URINALYSIS, ROUTINE W REFLEX MICROSCOPIC
Bilirubin Urine: NEGATIVE
Glucose, UA: NEGATIVE mg/dL
Hgb urine dipstick: NEGATIVE
Ketones, ur: NEGATIVE mg/dL
Leukocytes,Ua: NEGATIVE
Nitrite: NEGATIVE
Protein, ur: NEGATIVE mg/dL
Specific Gravity, Urine: 1.01 (ref 1.005–1.030)
pH: 8 (ref 5.0–8.0)

## 2023-03-10 LAB — CBC WITH DIFFERENTIAL/PLATELET
Abs Immature Granulocytes: 0.02 10*3/uL (ref 0.00–0.07)
Basophils Absolute: 0 10*3/uL (ref 0.0–0.1)
Basophils Relative: 1 %
Eosinophils Absolute: 0.1 10*3/uL (ref 0.0–0.5)
Eosinophils Relative: 2 %
HCT: 42.3 % (ref 36.0–46.0)
Hemoglobin: 14.6 g/dL (ref 12.0–15.0)
Immature Granulocytes: 0 %
Lymphocytes Relative: 31 %
Lymphs Abs: 1.4 10*3/uL (ref 0.7–4.0)
MCH: 32.2 pg (ref 26.0–34.0)
MCHC: 34.5 g/dL (ref 30.0–36.0)
MCV: 93.2 fL (ref 80.0–100.0)
Monocytes Absolute: 0.4 10*3/uL (ref 0.1–1.0)
Monocytes Relative: 9 %
Neutro Abs: 2.7 10*3/uL (ref 1.7–7.7)
Neutrophils Relative %: 57 %
Platelets: 155 10*3/uL (ref 150–400)
RBC: 4.54 MIL/uL (ref 3.87–5.11)
RDW: 11.9 % (ref 11.5–15.5)
WBC: 4.6 10*3/uL (ref 4.0–10.5)
nRBC: 0 % (ref 0.0–0.2)

## 2023-03-10 LAB — COMPREHENSIVE METABOLIC PANEL
ALT: 10 U/L (ref 0–44)
AST: 18 U/L (ref 15–41)
Albumin: 3.9 g/dL (ref 3.5–5.0)
Alkaline Phosphatase: 63 U/L (ref 38–126)
Anion gap: 10 (ref 5–15)
BUN: 11 mg/dL (ref 8–23)
CO2: 21 mmol/L — ABNORMAL LOW (ref 22–32)
Calcium: 9.1 mg/dL (ref 8.9–10.3)
Chloride: 108 mmol/L (ref 98–111)
Creatinine, Ser: 0.88 mg/dL (ref 0.44–1.00)
GFR, Estimated: >60 mL/min (ref >60)
Glucose, Bld: 111 mg/dL — ABNORMAL HIGH (ref 70–99)
Potassium: 4.2 mmol/L (ref 3.5–5.1)
Sodium: 139 mmol/L (ref 135–145)
Total Bilirubin: 0.6 mg/dL (ref 0.3–1.2)
Total Protein: 6.3 g/dL — ABNORMAL LOW (ref 6.5–8.1)

## 2023-03-10 MED ORDER — LIDOCAINE 5 % EX PTCH
1.0000 | MEDICATED_PATCH | Freq: Once | CUTANEOUS | Status: DC
  Administered 2023-03-10: 1 via TRANSDERMAL
  Filled 2023-03-10: qty 1

## 2023-03-10 MED ORDER — KETOROLAC TROMETHAMINE 30 MG/ML IJ SOLN
15.0000 mg | Freq: Once | INTRAMUSCULAR | Status: AC
  Administered 2023-03-11: 15 mg via INTRAVENOUS
  Filled 2023-03-10: qty 1

## 2023-03-10 MED ORDER — LORAZEPAM 1 MG PO TABS
1.0000 mg | ORAL_TABLET | Freq: Once | ORAL | Status: AC
  Administered 2023-03-11: 1 mg via ORAL
  Filled 2023-03-10: qty 1

## 2023-03-10 MED ORDER — HYDROMORPHONE HCL 1 MG/ML IJ SOLN
1.0000 mg | Freq: Once | INTRAMUSCULAR | Status: AC
  Administered 2023-03-11: 1 mg via INTRAVENOUS
  Filled 2023-03-10: qty 1

## 2023-03-10 MED ORDER — KETOROLAC TROMETHAMINE 15 MG/ML IJ SOLN
15.0000 mg | Freq: Once | INTRAMUSCULAR | Status: DC
  Filled 2023-03-10: qty 1

## 2023-03-10 MED ORDER — KETOROLAC TROMETHAMINE 15 MG/ML IJ SOLN
15.0000 mg | Freq: Once | INTRAMUSCULAR | Status: AC
  Administered 2023-03-10: 15 mg via INTRAMUSCULAR

## 2023-03-10 NOTE — ED Provider Notes (Signed)
[place] EMERGENCY DEPARTMENT AT [facility] Provider Note   CSN: [id number] Arrival date & time: 03/10/23  2255     History {Add pertinent medical, surgical, social history, OB history to HPI:1} Chief Complaint  Patient presents with   Back Pain    Christina Beasley is a 77 y.o. female.  Patient with a history of breast cancer in remission presenting with right-sided low back pain.  Pain is mild and going for the past 2 days.  Denies any trauma or fall.  Right paraspinal low back pain without radiation down her leg.  No focal weakness, numbness or tingling.  No bowel or bladder incontinence.  No fever or vomiting.  No pain with urination or blood in the urine.  She was seen at [facility] earlier today and left before completion of her care.  She had a negative urinalysis as well as CT scan that was negative for kidney stone or other acute pathology.  She is taking Tylenol, ibuprofen and Lidoderm patches without relief.  She has never had this kind of pain before.  Denies any fall or trauma.  Denies any incontinence.  States the pain is severe worse with movement and palpation.  The history is provided by the patient.  Back Pain Associated symptoms: no abdominal pain, no chest pain, no dysuria, no fever, no headaches and no weakness        Home Medications Prior to Admission medications   Medication Sig Start Date End Date Taking? Authorizing Provider  Alpha-Lipoic Acid 300 MG CAPS Take 2 capsules by mouth once daily. 03/03/22   Etta Grandchild, MD  aspirin 81 MG chewable tablet Chew 81 mg by mouth daily.    [provider]  chlorhexidine (PERIDEX) 0.12 % solution rinse and swish in mouth for 30 seconds then spit out and rinse mouth with water Mouth/Throat twice a day 14 days 01/05/22     clobetasol ointment (TEMOVATE) 0.05 % APPLY A THIN LAYER TO THE AFFECTED AREA(S) BY TOPICAL ROUTE once every night x 2 weeks and then space it out to twice a week therafter. 06/30/21      dexamethasone (DECADRON) 0.5 MG/5ML solution 10 mL Orally, swish for 30 seconds to 1 min then spit  and rinse with water twice a day 14 days 01/20/22     fluocinonide gel (LIDEX) 0.05 % Apply to affected area 4-5 times daily. 07/09/22     fluticasone (CUTIVATE) 0.05 % cream 1 application Externally Twice a day 14 day(s) 01/05/22     magic mouthwash (lidocaine, diphenhydrAMINE, alum & mag hydroxide) suspension Swish and spit 15 mLs every 6 (six) hours as needed for pain 07/09/22   Dutch Quint, MD  Multiple Vitamin (MULTIVITAMIN WITH MINERALS) TABS tablet Take 1 tablet by mouth daily.    [provider]  mupirocin ointment (BACTROBAN) 2 % 1 application Externally Twice a day 14 days 01/05/22     triamcinolone ointment (KENALOG) 0.5 % Apply 1 application topically 2 (two) times daily. 03/07/20   Hall-Potvin, [place], PA-C      Allergies    Patient has no known allergies.    Review of Systems   Review of Systems  Constitutional:  Negative for activity change, appetite change and fever.  HENT:  Negative for congestion and rhinorrhea.   Respiratory:  Negative for cough, chest tightness and shortness of breath.   Cardiovascular:  Negative for chest pain.  Gastrointestinal:  Negative for abdominal pain, nausea and vomiting.  Genitourinary:  Negative  for dysuria and hematuria.  Musculoskeletal:  Positive for back pain.  Skin:  Negative for rash.  Neurological:  Negative for dizziness, weakness and headaches.   all other systems are negative except as noted in the HPI and PMH.    Physical Exam Updated Vital Signs BP 103/74   Pulse 75   Temp 98.2 F (36.8 C) (Oral)   Resp 18   Ht 4\' 11"  (1.499 m)   Wt 63.5 kg   SpO2 97%   BMI 28.28 kg/m  Physical Exam Vitals and nursing note reviewed.  Constitutional:      General: She is not in acute distress.    Appearance: She is well-developed. She is not ill-appearing.  HENT:     Head: Normocephalic and atraumatic.     Mouth/Throat:      Pharynx: No oropharyngeal exudate.  Eyes:     Conjunctiva/sclera: Conjunctivae normal.     Pupils: Pupils are equal, round, and reactive to light.  Neck:     Comments: No meningismus. Cardiovascular:     Rate and Rhythm: Normal rate and regular rhythm.     Heart sounds: Normal heart sounds. No murmur heard. Pulmonary:     Effort: Pulmonary effort is normal. No respiratory distress.     Breath sounds: Normal breath sounds.  Abdominal:     Palpations: Abdomen is soft.     Tenderness: There is no abdominal tenderness. There is no guarding or rebound.  Musculoskeletal:        General: Tenderness present. Normal range of motion.     Cervical back: Normal range of motion and neck supple.     Comments: R paraspinal tenderness. No midline tenderness.  5/5 strength in bilateral lower extremities. Ankle plantar and dorsiflexion intact. Great toe extension intact bilaterally. +2 DP and PT pulses.   Skin:    General: Skin is warm.  Neurological:     Mental Status: She is alert and oriented to person, place, and time.     Cranial Nerves: No cranial nerve deficit.     Motor: No abnormal muscle tone.     Coordination: Coordination normal.     Comments:  5/5 strength throughout. CN 2-12 intact.Equal grip strength.   Psychiatric:        Behavior: Behavior normal.     ED Results / Procedures / Treatments   Labs (all labs ordered are listed, but only abnormal results are displayed) Labs Reviewed  CBC WITH DIFFERENTIAL/PLATELET  BASIC METABOLIC PANEL  URINALYSIS, ROUTINE W REFLEX MICROSCOPIC  D-DIMER, QUANTITATIVE    EKG None  Radiology CT Renal Stone Study  Result Date: 03/10/2023 CLINICAL DATA:  Abdominal/flank pain, stone suspected Right flank pain EXAM: CT ABDOMEN AND PELVIS WITHOUT CONTRAST TECHNIQUE: Multidetector CT imaging of the abdomen and pelvis was performed following the standard protocol without IV contrast. RADIATION DOSE REDUCTION: This exam was performed according to the  departmental dose-optimization program which includes automated exposure control, adjustment of the mA and/or kV according to patient size and/or use of iterative reconstruction technique. COMPARISON:  None Available. FINDINGS: Lower chest: There are patchy atelectatic changes in the visualized lung bases. No overt consolidation. No pleural effusion. The heart is normal in size. No pericardial effusion. Hepatobiliary: The liver is normal in size. Non-cirrhotic configuration. No suspicious mass. No intrahepatic or extrahepatic bile duct dilation. Gallbladder is surgically absent. Pancreas: Unremarkable. No pancreatic ductal dilatation or surrounding inflammatory changes. Spleen: Within normal limits. No focal lesion. Adrenals/Urinary Tract: There is a 1.2 by 1.8  cm left adrenal adenoma. Unremarkable right adrenal gland. No suspicious renal mass. There is a subcentimeter partially exophytic mildly hyperattenuating structure arising from the left kidney upper pole, laterally, which is too small to adequately characterize. No hydronephrosis. No renal or ureteric calculi. Urinary bladder is under distended, precluding optimal assessment. However, no large mass or stones identified. No perivesical fat stranding. Stomach/Bowel: No disproportionate dilation of the small or large bowel loops. No evidence of abnormal bowel wall thickening or inflammatory changes. The appendix was not visualized; however there is no acute inflammatory process in the right lower quadrant. There are scattered diverticula throughout the colon, without imaging signs of diverticulitis. Vascular/Lymphatic: No ascites or pneumoperitoneum. No abdominal or pelvic lymphadenopathy, by size criteria. No aneurysmal dilation of the major abdominal arteries. There are mild peripheral atherosclerotic vascular calcifications of the aorta and its major branches. Reproductive: The uterus is surgically absent. No large adnexal mass. Other: There is a tiny fat  containing umbilical hernia. The soft tissues and abdominal wall are otherwise unremarkable. Musculoskeletal: No suspicious osseous lesions. There are mild - moderate multilevel degenerative changes in the visualized spine. IMPRESSION: 1. No nephroureterolithiasis or obstructive uropathy. No acute inflammatory process identified within the abdomen or pelvis. 2. 1.8 cm left adrenal adenoma. 3. Multiple other nonacute observations, as described above. Aortic Atherosclerosis (ICD10-I70.0). Electronically Signed   By: Jules Schick M.D.   On: 03/10/2023 13:21   DG Pelvis 1-2 Views  Result Date: 03/10/2023 CLINICAL DATA:  Right posterior flank pain and hip pain EXAM: PELVIS - 1 VIEW COMPARISON:  None Available. FINDINGS: No fracture or dislocation. Preserved joint spaces and bone mineralization. Curvature and degenerative changes of the spine. There are some well rounded densities in the pelvis which are indeterminate although possibly vascular. Nonspecific bowel gas pattern with scattered stool. If there is further concern renal stones, CT could be considered as clinically appropriate. IMPRESSION: Degenerative changes.  Nonspecific bowel gas pattern. Electronically Signed   By: Karen Kays M.D.   On: 03/10/2023 12:28    Procedures Procedures  {Document cardiac monitor, telemetry assessment procedure when appropriate:1}  Medications Ordered in ED Medications  HYDROmorphone (DILAUDID) injection 1 mg (has no administration in time range)  ketorolac (TORADOL) 30 MG/ML injection 15 mg (has no administration in time range)  LORazepam (ATIVAN) tablet 1 mg (has no administration in time range)    ED Course/ Medical Decision Making/ A&P   {   Click here for ABCD2, HEART and other calculatorsREFRESH Note before signing :1}                              Medical Decision Making Amount and/or Complexity of Data Reviewed Labs: ordered. Decision-making details documented in ED Course. Radiology: ordered and  independent interpretation performed. Decision-making details documented in ED Course. ECG/medicine tests: ordered and independent interpretation performed. Decision-making details documented in ED Course.  Risk Prescription drug management.   2 days of right paraspinal lumbar pain, no midline pain.  Intact distal strength, sensation and pulses.  Low suspicion for cord compression or cauda equina syndrome.  Workup from earlier today reviewed.  Negative urinalysis.  CT negative for kidney stone or other acute finding.  {Document critical care time when appropriate:1} {Document review of labs and clinical decision tools ie heart score, Chads2Vasc2 etc:1}  {Document your independent review of radiology images, and any outside records:1} {Document your discussion with family members, caretakers, and with consultants:1} {Document social determinants  of health affecting pt's care:1} {Document your decision making why or why not admission, treatments were needed:1} Final Clinical Impression(s) / ED Diagnoses Final diagnoses:  None    Rx / DC Orders ED Discharge Orders     None

## 2023-03-10 NOTE — ED Provider Notes (Signed)
[facility] Provider Note   CSN: [id number] Arrival date & time: 03/10/23  1042     History  Chief Complaint  Patient presents with   Flank Pain    Christina Beasley is a 77 y.o. female.  Patient who works at [employer] presents emergency department for right posterior hip pain and lower back pain that started about 2 days ago.  Patient has continued to work, taking Tylenol, however today the Tylenol was no longer effective at controlling pain prompting emergency department visit.  Pain is worse with certain movements, like when she twists sitting in a chair, or with palpation directly over the area in the SI joint region of the right lower back.  No distal numbness or tingling.  She denies urinary symptoms including hematuria, dysuria, increased frequency or urgency.  She has not had nausea, vomiting, diarrhea or fevers.  She has a history of arthritis but does not typically get this sort of pain.       Home Medications Prior to Admission medications   Medication Sig Start Date End Date Taking? Authorizing Provider  Alpha-Lipoic Acid 300 MG CAPS Take 2 capsules by mouth once daily. 03/03/22   Etta Grandchild, MD  aspirin 81 MG chewable tablet Chew 81 mg by mouth daily.    [provider]  chlorhexidine (PERIDEX) 0.12 % solution rinse and swish in mouth for 30 seconds then spit out and rinse mouth with water Mouth/Throat twice a day 14 days 01/05/22     clobetasol ointment (TEMOVATE) 0.05 % APPLY A THIN LAYER TO THE AFFECTED AREA(S) BY TOPICAL ROUTE once every night x 2 weeks and then space it out to twice a week therafter. 06/30/21     dexamethasone (DECADRON) 0.5 MG/5ML solution 10 mL Orally, swish for 30 seconds to 1 min then spit  and rinse with water twice a day 14 days 01/20/22     fluocinonide gel (LIDEX) 0.05 % Apply to affected area 4-5 times daily. 07/09/22     fluticasone (CUTIVATE) 0.05 % cream 1 application Externally Twice a day  14 day(s) 01/05/22     magic mouthwash (lidocaine, diphenhydrAMINE, alum & mag hydroxide) suspension Swish and spit 15 mLs every 6 (six) hours as needed for pain 07/09/22   Dutch Quint, MD  Multiple Vitamin (MULTIVITAMIN WITH MINERALS) TABS tablet Take 1 tablet by mouth daily.    [provider]  mupirocin ointment (BACTROBAN) 2 % 1 application Externally Twice a day 14 days 01/05/22     triamcinolone ointment (KENALOG) 0.5 % Apply 1 application topically 2 (two) times daily. 03/07/20   Hall-Potvin, [place], PA-C      Allergies    Patient has no known allergies.    Review of Systems   Review of Systems  Physical Exam Updated Vital Signs BP (!) 171/88 (BP Location: Right Arm)   Pulse (!) 58   Temp 97.9 F (36.6 C) (Oral)   Resp 20   Ht 4\' 11"  (1.499 m)   Wt 63.5 kg   SpO2 99%   BMI 28.28 kg/m  Physical Exam Vitals and nursing note reviewed.  Constitutional:      General: She is not in acute distress.    Appearance: She is well-developed.  HENT:     Head: Normocephalic and atraumatic.     Right Ear: External ear normal.     Left Ear: External ear normal.     Nose: Nose normal.  Eyes:  Conjunctiva/sclera: Conjunctivae normal.  Cardiovascular:     Rate and Rhythm: Normal rate and regular rhythm.     Heart sounds: No murmur heard. Pulmonary:     Effort: No respiratory distress.     Breath sounds: No wheezing, rhonchi or rales.  Abdominal:     Palpations: Abdomen is soft.     Tenderness: There is no abdominal tenderness. There is no guarding or rebound.  Musculoskeletal:     Cervical back: Normal range of motion and neck supple.       Back:     Right lower leg: No edema.     Left lower leg: No edema.     Comments: Patient winces and yells out in pain when I press over the right SI joint area and posterior pelvis.  Skin in this area appears normal without ecchymosis or swelling.  Skin:    General: Skin is warm and dry.     Findings: No rash.  Neurological:      General: No focal deficit present.     Mental Status: She is alert. Mental status is at baseline.     Motor: No weakness.  Psychiatric:        Mood and Affect: Mood normal.     ED Results / Procedures / Treatments   Labs (all labs ordered are listed, but only abnormal results are displayed) Labs Reviewed  URINALYSIS, ROUTINE W REFLEX MICROSCOPIC - Abnormal; Notable for the following components:      Result Value   Color, Urine COLORLESS (*)    All other components within normal limits  COMPREHENSIVE METABOLIC PANEL - Abnormal; Notable for the following components:   CO2 21 (*)    Glucose, Bld 111 (*)    Total Protein 6.3 (*)    All other components within normal limits  CBC WITH DIFFERENTIAL/PLATELET    EKG None  Radiology CT Renal Stone Study  Result Date: 03/10/2023 CLINICAL DATA:  Abdominal/flank pain, stone suspected Right flank pain EXAM: CT ABDOMEN AND PELVIS WITHOUT CONTRAST TECHNIQUE: Multidetector CT imaging of the abdomen and pelvis was performed following the standard protocol without IV contrast. RADIATION DOSE REDUCTION: This exam was performed according to the departmental dose-optimization program which includes automated exposure control, adjustment of the mA and/or kV according to patient size and/or use of iterative reconstruction technique. COMPARISON:  None Available. FINDINGS: Lower chest: There are patchy atelectatic changes in the visualized lung bases. No overt consolidation. No pleural effusion. The heart is normal in size. No pericardial effusion. Hepatobiliary: The liver is normal in size. Non-cirrhotic configuration. No suspicious mass. No intrahepatic or extrahepatic bile duct dilation. Gallbladder is surgically absent. Pancreas: Unremarkable. No pancreatic ductal dilatation or surrounding inflammatory changes. Spleen: Within normal limits. No focal lesion. Adrenals/Urinary Tract: There is a 1.2 by 1.8 cm left adrenal adenoma. Unremarkable right adrenal  gland. No suspicious renal mass. There is a subcentimeter partially exophytic mildly hyperattenuating structure arising from the left kidney upper pole, laterally, which is too small to adequately characterize. No hydronephrosis. No renal or ureteric calculi. Urinary bladder is under distended, precluding optimal assessment. However, no large mass or stones identified. No perivesical fat stranding. Stomach/Bowel: No disproportionate dilation of the small or large bowel loops. No evidence of abnormal bowel wall thickening or inflammatory changes. The appendix was not visualized; however there is no acute inflammatory process in the right lower quadrant. There are scattered diverticula throughout the colon, without imaging signs of diverticulitis. Vascular/Lymphatic: No ascites or pneumoperitoneum. No abdominal  or pelvic lymphadenopathy, by size criteria. No aneurysmal dilation of the major abdominal arteries. There are mild peripheral atherosclerotic vascular calcifications of the aorta and its major branches. Reproductive: The uterus is surgically absent. No large adnexal mass. Other: There is a tiny fat containing umbilical hernia. The soft tissues and abdominal wall are otherwise unremarkable. Musculoskeletal: No suspicious osseous lesions. There are mild - moderate multilevel degenerative changes in the visualized spine. IMPRESSION: 1. No nephroureterolithiasis or obstructive uropathy. No acute inflammatory process identified within the abdomen or pelvis. 2. 1.8 cm left adrenal adenoma. 3. Multiple other nonacute observations, as described above. Aortic Atherosclerosis (ICD10-I70.0). Electronically Signed   By: Jules Schick M.D.   On: 03/10/2023 13:21   DG Pelvis 1-2 Views  Result Date: 03/10/2023 CLINICAL DATA:  Right posterior flank pain and hip pain EXAM: PELVIS - 1 VIEW COMPARISON:  None Available. FINDINGS: No fracture or dislocation. Preserved joint spaces and bone mineralization. Curvature and  degenerative changes of the spine. There are some well rounded densities in the pelvis which are indeterminate although possibly vascular. Nonspecific bowel gas pattern with scattered stool. If there is further concern renal stones, CT could be considered as clinically appropriate. IMPRESSION: Degenerative changes.  Nonspecific bowel gas pattern. Electronically Signed   By: Karen Kays M.D.   On: 03/10/2023 12:28    Procedures Procedures    Medications Ordered in ED Medications  lidocaine (LIDODERM) 5 % 1 patch (has no administration in time range)    ED Course/ Medical Decision Making/ A&P    Patient seen and examined. History obtained directly from patient.   Labs/EKG: UA ordered in triage personally reviewed interpreted, agree negative  Imaging: Ordered x-ray of the pelvis.  Medications/Fluids: Ordered: Lidoderm patch.  No contraindications for NSAIDs, however patient states that she just was prescribed meloxicam by her PCP for ankle pain.  She took 1 dose and had severe vertigo and discontinued.  She has taken ibuprofen and naproxen in the past without side effects.  Most recent vital signs reviewed and are as follows: BP (!) 171/88 (BP Location: Right Arm)   Pulse (!) 58   Temp 97.9 F (36.6 C) (Oral)   Resp 20   Ht 4\' 11"  (1.499 m)   Wt 63.5 kg   SpO2 99%   BMI 28.28 kg/m   Initial impression: Musculoskeletal pain, no anterior abdominal pain.  Overall symptoms not concerning for renal colic.  2:01 PM Informed that patient eloped.  RN did inform me that patient was taking her own Tylenol.  Labs personally reviewed and interpreted including: CBC unremarkable; CMP unremarkable; UA unremarkable.  Imaging personally visualized and interpreted including: Agree no acute findings which would explain the patient's pain  Patient eloped prior to me being able to discuss results with her.  I do not see anything in her lab work or imaging which would require me to call her to  follow-up.                                Medical Decision Making Amount and/or Complexity of Data Reviewed Labs: ordered. Radiology: ordered.  Risk Prescription drug management.   Patient with back and flank pain, suspect musculoskeletal as patient is point tender over the lower back and SI joint area.  No skin findings or rashes.  Entertained possibility of kidney stone, CT negative.  UA and lab workup is reassuring.  Patient eloped prior to completion of work-up.  Final Clinical Impression(s) / ED Diagnoses Final diagnoses:  Acute right-sided low back pain without sciatica    Rx / DC Orders ED Discharge Orders     None         Renne Crigler, PA-C 03/10/23 1405    Ernie Avena, MD 03/10/23 [phone]

## 2023-03-10 NOTE — ED Triage Notes (Signed)
Pt arrived POV, caox4, ambulatory c/o R flank pain and R lower back pain stating "it feels like a kidney stone" however denies PMH of same. Pain has been ongoing since Monday, pt denies any changes in urinary frequency, hematuria, painful urination, N/V, fever.

## 2023-03-10 NOTE — ED Triage Notes (Addendum)
Pt BIB [facility] d/t lower right back/ right hip pain.  Pt was seen at [facility] today and left AMA d/t "not doing enough". She was given ToradolIM and Lidocaine patch and pt did/does NOT want an IV therefore NO IV meds states EMS.

## 2023-03-10 NOTE — ED Notes (Signed)
Left AMA.

## 2023-03-10 NOTE — ED Notes (Signed)
Pt informed secretary that she is leaving prior to d/c.

## 2023-03-11 ENCOUNTER — Encounter (HOSPITAL_COMMUNITY): Payer: Self-pay

## 2023-03-11 ENCOUNTER — Emergency Department (HOSPITAL_COMMUNITY): Payer: Medicare Other

## 2023-03-11 DIAGNOSIS — M5136 Other intervertebral disc degeneration, lumbar region: Secondary | ICD-10-CM | POA: Diagnosis not present

## 2023-03-11 DIAGNOSIS — K5730 Diverticulosis of large intestine without perforation or abscess without bleeding: Secondary | ICD-10-CM | POA: Diagnosis not present

## 2023-03-11 DIAGNOSIS — I700 Atherosclerosis of aorta: Secondary | ICD-10-CM | POA: Diagnosis not present

## 2023-03-11 DIAGNOSIS — M4184 Other forms of scoliosis, thoracic region: Secondary | ICD-10-CM | POA: Diagnosis not present

## 2023-03-11 DIAGNOSIS — M545 Low back pain, unspecified: Secondary | ICD-10-CM | POA: Diagnosis not present

## 2023-03-11 MED ORDER — NAPROXEN 500 MG PO TABS: 500.0000 mg | ORAL_TABLET | Freq: Two times a day (BID) | ORAL | 0 refills | Status: AC | PRN

## 2023-03-11 MED ORDER — OXYCODONE-ACETAMINOPHEN 5-325 MG PO TABS: 1.0000 | ORAL_TABLET | Freq: Once | ORAL | Status: DC

## 2023-03-11 MED ORDER — SODIUM CHLORIDE (PF) 0.9 % IJ SOLN
INTRAMUSCULAR | Status: AC
  Filled 2023-03-11: qty 50

## 2023-03-11 MED ORDER — LIDOCAINE 5 % EX PTCH: 1.0000 | MEDICATED_PATCH | CUTANEOUS | 0 refills | Status: AC

## 2023-03-11 MED ORDER — METHOCARBAMOL 500 MG PO TABS: 500.0000 mg | ORAL_TABLET | Freq: Three times a day (TID) | ORAL | 0 refills | Status: AC | PRN

## 2023-03-11 MED ORDER — IOHEXOL 350 MG/ML SOLN
100.0000 mL | Freq: Once | INTRAVENOUS | Status: AC | PRN
  Administered 2023-03-11: 100 mL via INTRAVENOUS

## 2023-03-11 MED ORDER — OXYCODONE HCL 5 MG PO TABS
5.0000 mg | ORAL_TABLET | Freq: Once | ORAL | Status: AC
  Administered 2023-03-11: 5 mg via ORAL
  Filled 2023-03-11: qty 1

## 2023-03-11 MED ORDER — ONDANSETRON HCL 4 MG/2ML IJ SOLN
4.0000 mg | Freq: Once | INTRAMUSCULAR | Status: AC
  Administered 2023-03-11: 4 mg via INTRAVENOUS
  Filled 2023-03-11: qty 2

## 2023-03-11 NOTE — Discharge Instructions (Signed)
Your testing is reassuring.  No evidence of urinary tract infection or kidney stone.  We suspect you are likely having musculoskeletal back pain or spasm.  It is possibly also passed a kidney stone.  Take the pain medication anti-inflammatories as prescribed.  Follow-up with your primary doctor.  Return to the ED for worsening pain, weakness, numbness, tingling, bowel or bladder incontinence or other concerns.

## 2023-03-11 NOTE — ED Notes (Signed)
Pt placed on 2L O2 after Dilaudid

## 2023-03-18 ENCOUNTER — Telehealth: Payer: Self-pay

## 2023-03-18 DIAGNOSIS — M9905 Segmental and somatic dysfunction of pelvic region: Secondary | ICD-10-CM | POA: Diagnosis not present

## 2023-03-18 DIAGNOSIS — M5136 Other intervertebral disc degeneration, lumbar region: Secondary | ICD-10-CM | POA: Diagnosis not present

## 2023-03-18 DIAGNOSIS — M25551 Pain in right hip: Secondary | ICD-10-CM | POA: Diagnosis not present

## 2023-03-18 DIAGNOSIS — M9903 Segmental and somatic dysfunction of lumbar region: Secondary | ICD-10-CM | POA: Diagnosis not present

## 2023-03-18 NOTE — Telephone Encounter (Signed)
Transition Care Management Unsuccessful Follow-up Telephone Call  Date of discharge and from where:  [facility] 8/8  Attempts:  1st Attempt  Reason for unsuccessful TCM follow-up call:  No answer/busy   Christina Beasley [facility] Guide, [facility] [phone] [address] [place], [place], [place] [address] Phone: [phone] Email: [email or web address]

## 2023-03-18 NOTE — Telephone Encounter (Signed)
Transition Care Management Unsuccessful Follow-up Telephone Call  Date of discharge and from where:  [facility]  Attempts:  2nd Attempt  Reason for unsuccessful TCM follow-up call:  No answer/busy   Lenard Forth [facility] Guide, [facility] [phone] [address] [place], [place], [place] [address] Phone: [phone] Email: [email or web address]

## 2023-03-18 NOTE — Telephone Encounter (Signed)
Transition Care Management Unsuccessful Follow-up Telephone Call  Date of discharge and from where:  [facility]  Attempts:  2nd Attempt  Reason for unsuccessful TCM follow-up call:  No answer/busy   Christina Beasley [facility] Guide, [facility] [phone] [address] [place], [place], [place] [address] Phone: [phone] Email: [email or web address]

## 2023-03-25 DIAGNOSIS — M9903 Segmental and somatic dysfunction of lumbar region: Secondary | ICD-10-CM | POA: Diagnosis not present

## 2023-03-25 DIAGNOSIS — M25551 Pain in right hip: Secondary | ICD-10-CM | POA: Diagnosis not present

## 2023-03-25 DIAGNOSIS — M9905 Segmental and somatic dysfunction of pelvic region: Secondary | ICD-10-CM | POA: Diagnosis not present

## 2023-03-25 DIAGNOSIS — M5136 Other intervertebral disc degeneration, lumbar region: Secondary | ICD-10-CM | POA: Diagnosis not present

## 2023-03-29 DIAGNOSIS — Z961 Presence of intraocular lens: Secondary | ICD-10-CM | POA: Diagnosis not present

## 2023-03-29 DIAGNOSIS — H2511 Age-related nuclear cataract, right eye: Secondary | ICD-10-CM | POA: Diagnosis not present

## 2023-03-29 DIAGNOSIS — H25811 Combined forms of age-related cataract, right eye: Secondary | ICD-10-CM | POA: Diagnosis not present

## 2023-04-01 DIAGNOSIS — M9905 Segmental and somatic dysfunction of pelvic region: Secondary | ICD-10-CM | POA: Diagnosis not present

## 2023-04-01 DIAGNOSIS — M9903 Segmental and somatic dysfunction of lumbar region: Secondary | ICD-10-CM | POA: Diagnosis not present

## 2023-04-01 DIAGNOSIS — M5136 Other intervertebral disc degeneration, lumbar region: Secondary | ICD-10-CM | POA: Diagnosis not present

## 2023-04-01 DIAGNOSIS — M25551 Pain in right hip: Secondary | ICD-10-CM | POA: Diagnosis not present

## 2023-04-08 DIAGNOSIS — M5136 Other intervertebral disc degeneration, lumbar region: Secondary | ICD-10-CM | POA: Diagnosis not present

## 2023-04-08 DIAGNOSIS — M25551 Pain in right hip: Secondary | ICD-10-CM | POA: Diagnosis not present

## 2023-04-08 DIAGNOSIS — M9903 Segmental and somatic dysfunction of lumbar region: Secondary | ICD-10-CM | POA: Diagnosis not present

## 2023-04-08 DIAGNOSIS — M9905 Segmental and somatic dysfunction of pelvic region: Secondary | ICD-10-CM | POA: Diagnosis not present

## 2023-04-15 DIAGNOSIS — M25551 Pain in right hip: Secondary | ICD-10-CM | POA: Diagnosis not present

## 2023-04-15 DIAGNOSIS — M9905 Segmental and somatic dysfunction of pelvic region: Secondary | ICD-10-CM | POA: Diagnosis not present

## 2023-04-15 DIAGNOSIS — M9903 Segmental and somatic dysfunction of lumbar region: Secondary | ICD-10-CM | POA: Diagnosis not present

## 2023-04-15 DIAGNOSIS — M5136 Other intervertebral disc degeneration, lumbar region: Secondary | ICD-10-CM | POA: Diagnosis not present

## 2023-04-22 DIAGNOSIS — M5136 Other intervertebral disc degeneration, lumbar region: Secondary | ICD-10-CM | POA: Diagnosis not present

## 2023-04-22 DIAGNOSIS — M9905 Segmental and somatic dysfunction of pelvic region: Secondary | ICD-10-CM | POA: Diagnosis not present

## 2023-04-22 DIAGNOSIS — M25551 Pain in right hip: Secondary | ICD-10-CM | POA: Diagnosis not present

## 2023-04-22 DIAGNOSIS — M9903 Segmental and somatic dysfunction of lumbar region: Secondary | ICD-10-CM | POA: Diagnosis not present

## 2023-04-29 DIAGNOSIS — M9903 Segmental and somatic dysfunction of lumbar region: Secondary | ICD-10-CM | POA: Diagnosis not present

## 2023-04-29 DIAGNOSIS — M5136 Other intervertebral disc degeneration, lumbar region: Secondary | ICD-10-CM | POA: Diagnosis not present

## 2023-04-29 DIAGNOSIS — M9905 Segmental and somatic dysfunction of pelvic region: Secondary | ICD-10-CM | POA: Diagnosis not present

## 2023-04-29 DIAGNOSIS — M25551 Pain in right hip: Secondary | ICD-10-CM | POA: Diagnosis not present

## 2023-04-30 DIAGNOSIS — H35352 Cystoid macular degeneration, left eye: Secondary | ICD-10-CM | POA: Diagnosis not present

## 2023-04-30 DIAGNOSIS — Z961 Presence of intraocular lens: Secondary | ICD-10-CM | POA: Diagnosis not present

## 2023-05-13 ENCOUNTER — Other Ambulatory Visit: Payer: Self-pay | Admitting: Family

## 2023-05-13 DIAGNOSIS — M545 Low back pain, unspecified: Secondary | ICD-10-CM | POA: Diagnosis not present

## 2023-05-13 DIAGNOSIS — N898 Other specified noninflammatory disorders of vagina: Secondary | ICD-10-CM | POA: Diagnosis not present

## 2023-05-13 DIAGNOSIS — M25551 Pain in right hip: Secondary | ICD-10-CM | POA: Diagnosis not present

## 2023-05-13 DIAGNOSIS — N644 Mastodynia: Secondary | ICD-10-CM | POA: Diagnosis not present

## 2023-05-13 DIAGNOSIS — M9905 Segmental and somatic dysfunction of pelvic region: Secondary | ICD-10-CM | POA: Diagnosis not present

## 2023-05-13 DIAGNOSIS — M9903 Segmental and somatic dysfunction of lumbar region: Secondary | ICD-10-CM | POA: Diagnosis not present

## 2023-05-14 IMAGING — MG MM DIGITAL SCREENING BILAT W/ TOMO AND CAD
8 series · 8 of 24 positions shown · non-contrast
Comparison: Previous exam(s).

CLINICAL DATA: Screening.

EXAM:
DIGITAL SCREENING BILATERAL MAMMOGRAM WITH TOMOSYNTHESIS AND CAD
TECHNIQUE: Bilateral screening digital craniocaudal and mediolateral oblique
mammograms were obtained. Bilateral screening digital breast
tomosynthesis was performed. The images were evaluated with
computer-aided detection.

[L MLO synth-2D]
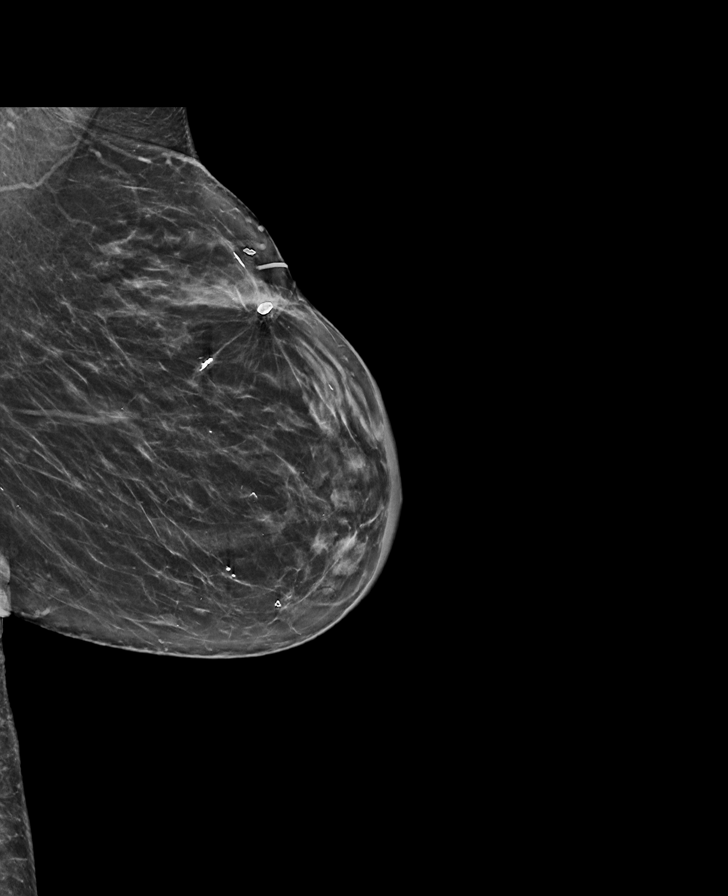

[R CC synth-2D]
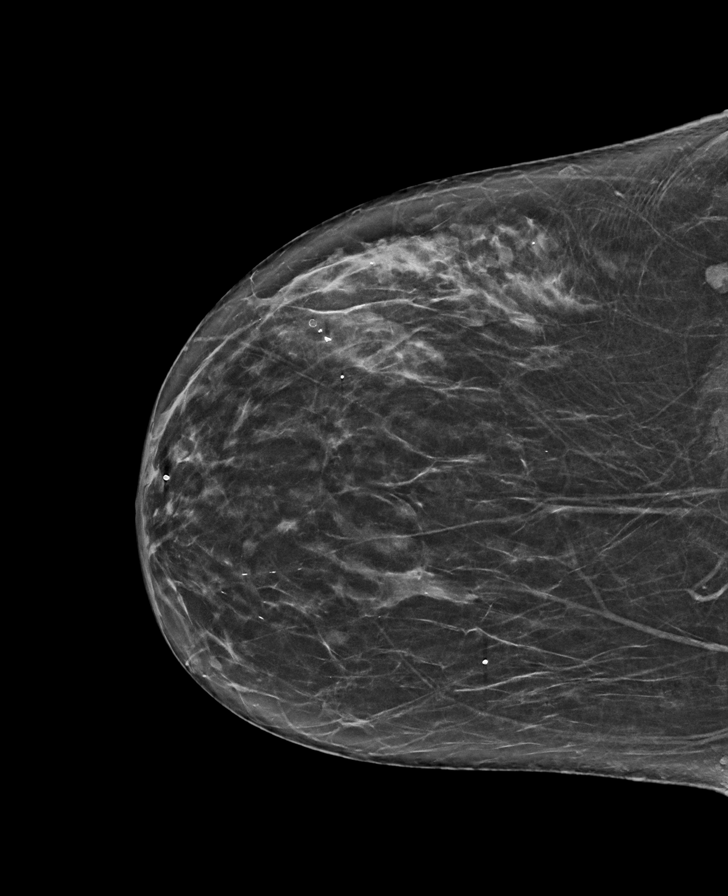

[L CC synth-2D]
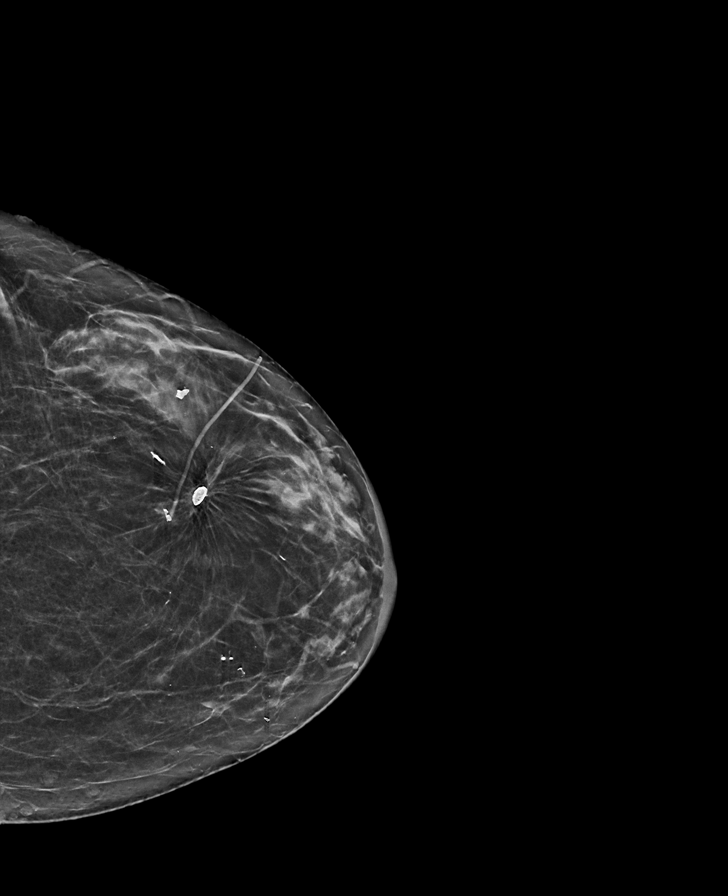

[R MLO synth-2D]
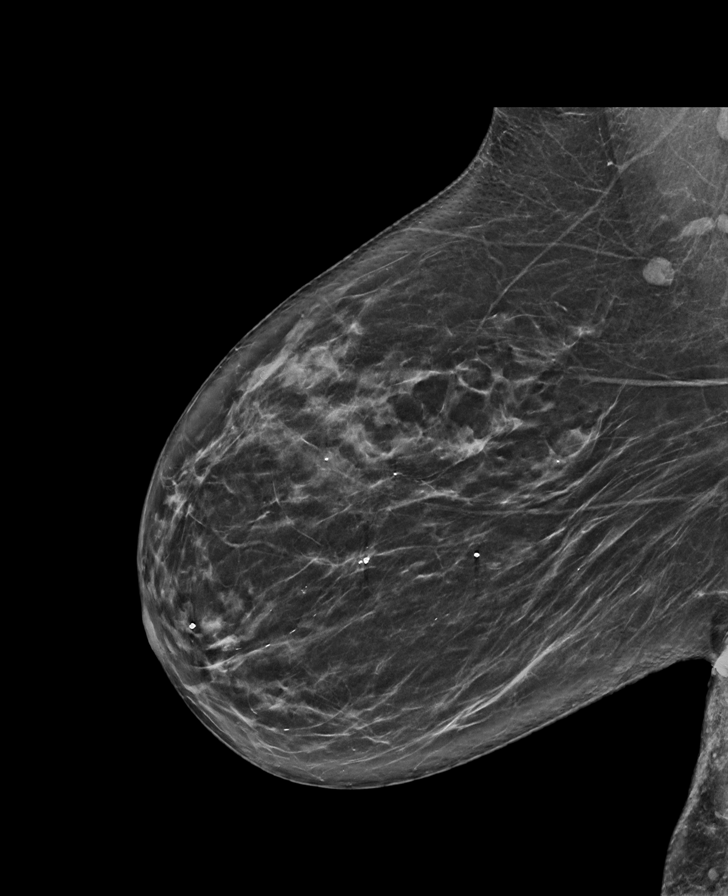

[L CC tomo · tomo slice 30/59.0]
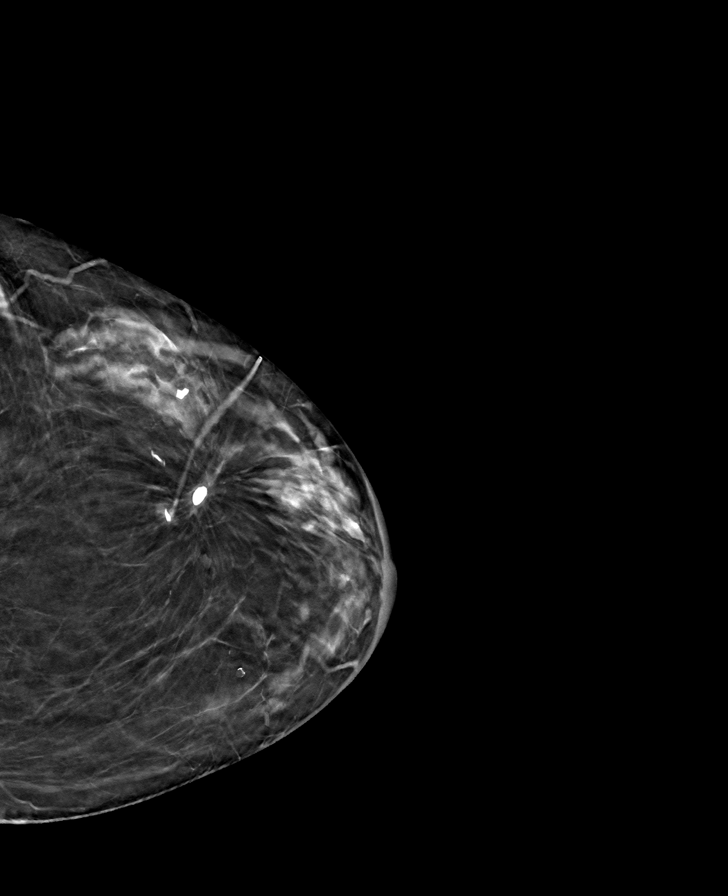

[R MLO tomo · tomo slice 37/72.0]
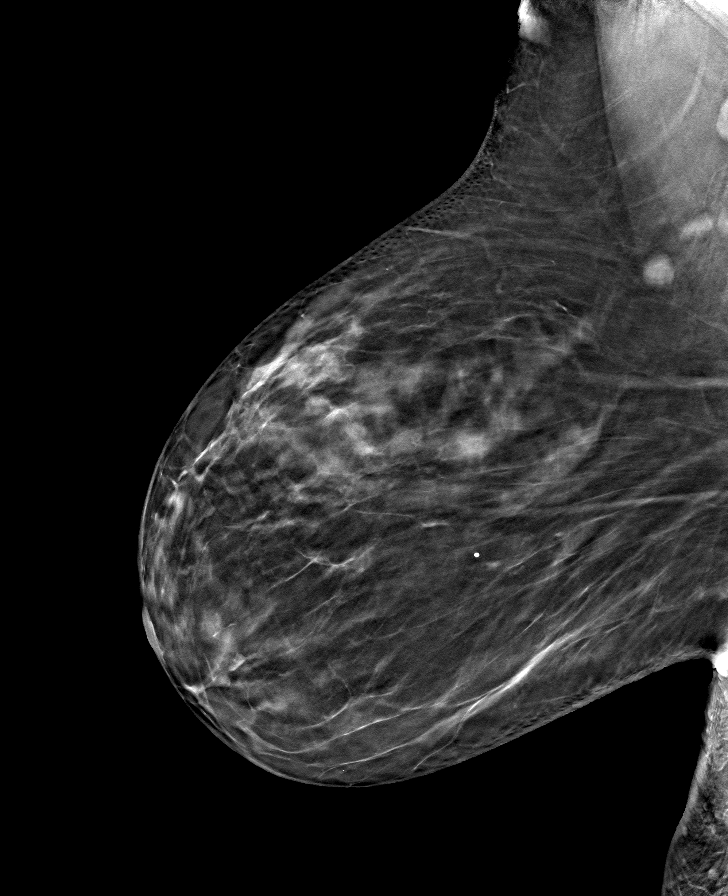

[R CC tomo · tomo slice 32/63.0]
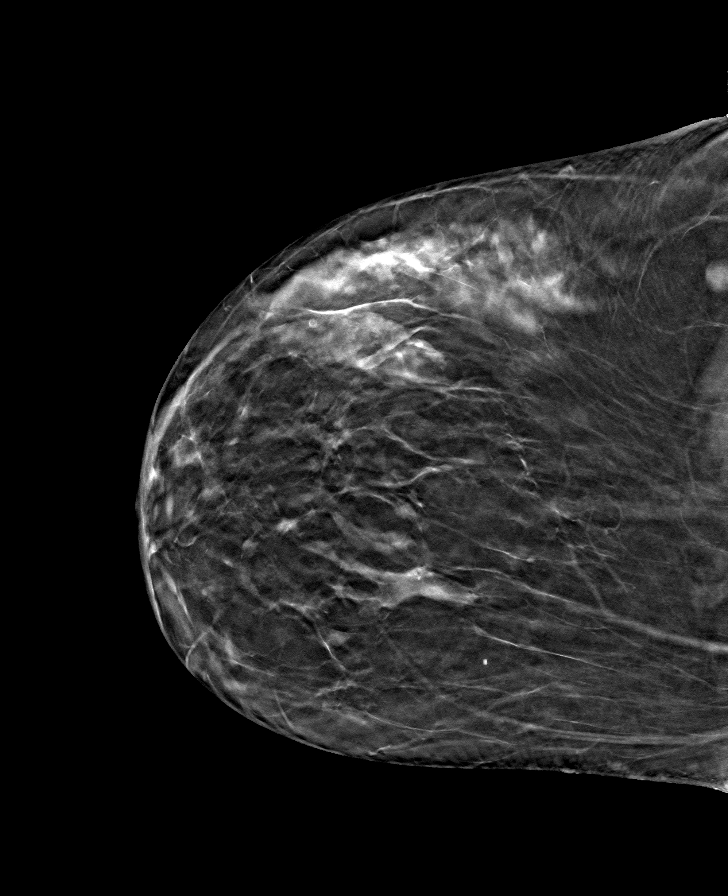

[L MLO tomo · tomo slice 33/66.0]
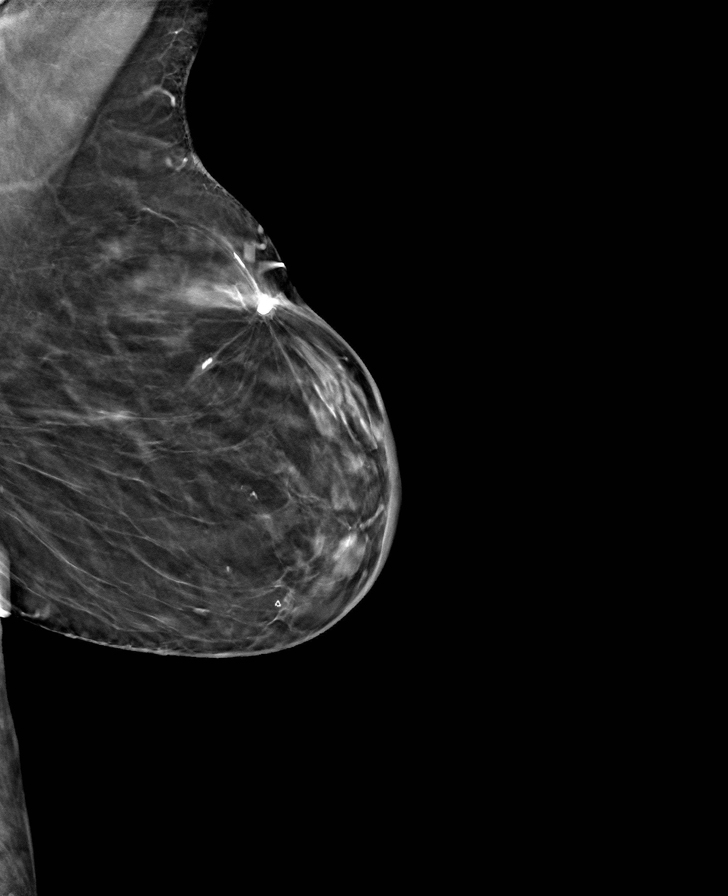

[8 of 24 positions shown; findings below may reference images not displayed]

ACR Breast Density Category b: There are scattered areas of
fibroglandular density.
FINDINGS: There are no findings suspicious for malignancy.
IMPRESSION: No mammographic evidence of malignancy. A result letter of this
screening mammogram will be mailed directly to the patient.

RECOMMENDATION:
Screening mammogram in one year. (Code:51-O-LD2)

BI-RADS CATEGORY  1: Negative.

## 2023-05-20 DIAGNOSIS — M9903 Segmental and somatic dysfunction of lumbar region: Secondary | ICD-10-CM | POA: Diagnosis not present

## 2023-05-20 DIAGNOSIS — M25551 Pain in right hip: Secondary | ICD-10-CM | POA: Diagnosis not present

## 2023-05-20 DIAGNOSIS — M9905 Segmental and somatic dysfunction of pelvic region: Secondary | ICD-10-CM | POA: Diagnosis not present

## 2023-05-20 DIAGNOSIS — M545 Low back pain, unspecified: Secondary | ICD-10-CM | POA: Diagnosis not present

## 2023-05-27 DIAGNOSIS — M25551 Pain in right hip: Secondary | ICD-10-CM | POA: Diagnosis not present

## 2023-05-27 DIAGNOSIS — M9903 Segmental and somatic dysfunction of lumbar region: Secondary | ICD-10-CM | POA: Diagnosis not present

## 2023-05-27 DIAGNOSIS — H35372 Puckering of macula, left eye: Secondary | ICD-10-CM | POA: Diagnosis not present

## 2023-05-27 DIAGNOSIS — H35352 Cystoid macular degeneration, left eye: Secondary | ICD-10-CM | POA: Diagnosis not present

## 2023-05-27 DIAGNOSIS — M9905 Segmental and somatic dysfunction of pelvic region: Secondary | ICD-10-CM | POA: Diagnosis not present

## 2023-05-27 DIAGNOSIS — M545 Low back pain, unspecified: Secondary | ICD-10-CM | POA: Diagnosis not present

## 2023-05-27 DIAGNOSIS — Z961 Presence of intraocular lens: Secondary | ICD-10-CM | POA: Diagnosis not present

## 2023-06-03 ENCOUNTER — Inpatient Hospital Stay: Admission: RE | Admit: 2023-06-03 | Payer: Medicare Other | Source: Ambulatory Visit

## 2023-06-03 ENCOUNTER — Other Ambulatory Visit: Payer: Medicare Other

## 2023-06-03 DIAGNOSIS — M545 Low back pain, unspecified: Secondary | ICD-10-CM | POA: Diagnosis not present

## 2023-06-03 DIAGNOSIS — M9905 Segmental and somatic dysfunction of pelvic region: Secondary | ICD-10-CM | POA: Diagnosis not present

## 2023-06-03 DIAGNOSIS — M25551 Pain in right hip: Secondary | ICD-10-CM | POA: Diagnosis not present

## 2023-06-03 DIAGNOSIS — M9903 Segmental and somatic dysfunction of lumbar region: Secondary | ICD-10-CM | POA: Diagnosis not present

## 2023-06-10 DIAGNOSIS — M9903 Segmental and somatic dysfunction of lumbar region: Secondary | ICD-10-CM | POA: Diagnosis not present

## 2023-06-10 DIAGNOSIS — M25551 Pain in right hip: Secondary | ICD-10-CM | POA: Diagnosis not present

## 2023-06-10 DIAGNOSIS — M545 Low back pain, unspecified: Secondary | ICD-10-CM | POA: Diagnosis not present

## 2023-06-10 DIAGNOSIS — M9905 Segmental and somatic dysfunction of pelvic region: Secondary | ICD-10-CM | POA: Diagnosis not present

## 2023-06-17 DIAGNOSIS — M25551 Pain in right hip: Secondary | ICD-10-CM | POA: Diagnosis not present

## 2023-06-17 DIAGNOSIS — M545 Low back pain, unspecified: Secondary | ICD-10-CM | POA: Diagnosis not present

## 2023-06-17 DIAGNOSIS — M9905 Segmental and somatic dysfunction of pelvic region: Secondary | ICD-10-CM | POA: Diagnosis not present

## 2023-06-17 DIAGNOSIS — M9903 Segmental and somatic dysfunction of lumbar region: Secondary | ICD-10-CM | POA: Diagnosis not present

## 2023-06-18 DIAGNOSIS — H35372 Puckering of macula, left eye: Secondary | ICD-10-CM | POA: Diagnosis not present

## 2023-06-18 DIAGNOSIS — Z961 Presence of intraocular lens: Secondary | ICD-10-CM | POA: Diagnosis not present

## 2023-06-19 DIAGNOSIS — Z23 Encounter for immunization: Secondary | ICD-10-CM | POA: Diagnosis not present

## 2023-06-24 DIAGNOSIS — M9903 Segmental and somatic dysfunction of lumbar region: Secondary | ICD-10-CM | POA: Diagnosis not present

## 2023-06-24 DIAGNOSIS — M545 Low back pain, unspecified: Secondary | ICD-10-CM | POA: Diagnosis not present

## 2023-06-24 DIAGNOSIS — M25551 Pain in right hip: Secondary | ICD-10-CM | POA: Diagnosis not present

## 2023-06-24 DIAGNOSIS — M9905 Segmental and somatic dysfunction of pelvic region: Secondary | ICD-10-CM | POA: Diagnosis not present

## 2023-07-08 ENCOUNTER — Ambulatory Visit: Payer: Medicare Other

## 2023-07-08 ENCOUNTER — Ambulatory Visit
Admission: RE | Admit: 2023-07-08 | Discharge: 2023-07-08 | Disposition: A | Discharge status: Home / Self Care | Payer: Medicare Other | Source: Ambulatory Visit | Attending: Family | Admitting: Family

## 2023-07-08 DIAGNOSIS — M9905 Segmental and somatic dysfunction of pelvic region: Secondary | ICD-10-CM | POA: Diagnosis not present

## 2023-07-08 DIAGNOSIS — N644 Mastodynia: Secondary | ICD-10-CM | POA: Diagnosis not present

## 2023-07-08 DIAGNOSIS — M545 Low back pain, unspecified: Secondary | ICD-10-CM | POA: Diagnosis not present

## 2023-07-08 DIAGNOSIS — M9903 Segmental and somatic dysfunction of lumbar region: Secondary | ICD-10-CM | POA: Diagnosis not present

## 2023-07-08 DIAGNOSIS — M25551 Pain in right hip: Secondary | ICD-10-CM | POA: Diagnosis not present

## 2023-07-15 DIAGNOSIS — M25551 Pain in right hip: Secondary | ICD-10-CM | POA: Diagnosis not present

## 2023-07-15 DIAGNOSIS — M9903 Segmental and somatic dysfunction of lumbar region: Secondary | ICD-10-CM | POA: Diagnosis not present

## 2023-07-15 DIAGNOSIS — M545 Low back pain, unspecified: Secondary | ICD-10-CM | POA: Diagnosis not present

## 2023-07-15 DIAGNOSIS — M9905 Segmental and somatic dysfunction of pelvic region: Secondary | ICD-10-CM | POA: Diagnosis not present

## 2023-07-22 DIAGNOSIS — M9905 Segmental and somatic dysfunction of pelvic region: Secondary | ICD-10-CM | POA: Diagnosis not present

## 2023-07-22 DIAGNOSIS — M25551 Pain in right hip: Secondary | ICD-10-CM | POA: Diagnosis not present

## 2023-07-22 DIAGNOSIS — M545 Low back pain, unspecified: Secondary | ICD-10-CM | POA: Diagnosis not present

## 2023-07-22 DIAGNOSIS — M9903 Segmental and somatic dysfunction of lumbar region: Secondary | ICD-10-CM | POA: Diagnosis not present

## 2023-08-05 DIAGNOSIS — M9905 Segmental and somatic dysfunction of pelvic region: Secondary | ICD-10-CM | POA: Diagnosis not present

## 2023-08-05 DIAGNOSIS — M25551 Pain in right hip: Secondary | ICD-10-CM | POA: Diagnosis not present

## 2023-08-05 DIAGNOSIS — M9903 Segmental and somatic dysfunction of lumbar region: Secondary | ICD-10-CM | POA: Diagnosis not present

## 2023-08-05 DIAGNOSIS — M545 Low back pain, unspecified: Secondary | ICD-10-CM | POA: Diagnosis not present

## 2023-08-12 DIAGNOSIS — M545 Low back pain, unspecified: Secondary | ICD-10-CM | POA: Diagnosis not present

## 2023-08-12 DIAGNOSIS — M9903 Segmental and somatic dysfunction of lumbar region: Secondary | ICD-10-CM | POA: Diagnosis not present

## 2023-08-12 DIAGNOSIS — M9905 Segmental and somatic dysfunction of pelvic region: Secondary | ICD-10-CM | POA: Diagnosis not present

## 2023-08-12 DIAGNOSIS — M25551 Pain in right hip: Secondary | ICD-10-CM | POA: Diagnosis not present

## 2023-08-19 DIAGNOSIS — M25551 Pain in right hip: Secondary | ICD-10-CM | POA: Diagnosis not present

## 2023-08-19 DIAGNOSIS — M9905 Segmental and somatic dysfunction of pelvic region: Secondary | ICD-10-CM | POA: Diagnosis not present

## 2023-08-19 DIAGNOSIS — M9903 Segmental and somatic dysfunction of lumbar region: Secondary | ICD-10-CM | POA: Diagnosis not present

## 2023-08-19 DIAGNOSIS — M545 Low back pain, unspecified: Secondary | ICD-10-CM | POA: Diagnosis not present

## 2023-09-02 DIAGNOSIS — L439 Lichen planus, unspecified: Secondary | ICD-10-CM | POA: Diagnosis not present

## 2023-09-02 DIAGNOSIS — Z6829 Body mass index (BMI) 29.0-29.9, adult: Secondary | ICD-10-CM | POA: Diagnosis not present

## 2023-09-02 DIAGNOSIS — R32 Unspecified urinary incontinence: Secondary | ICD-10-CM | POA: Diagnosis not present

## 2023-09-02 DIAGNOSIS — N952 Postmenopausal atrophic vaginitis: Secondary | ICD-10-CM | POA: Diagnosis not present

## 2023-09-02 DIAGNOSIS — Z01419 Encounter for gynecological examination (general) (routine) without abnormal findings: Secondary | ICD-10-CM | POA: Diagnosis not present

## 2023-09-23 ENCOUNTER — Encounter: Payer: Self-pay | Admitting: Internal Medicine

## 2024-06-06 ENCOUNTER — Other Ambulatory Visit: Payer: Self-pay | Admitting: Obstetrics and Gynecology

## 2024-06-06 DIAGNOSIS — Z1231 Encounter for screening mammogram for malignant neoplasm of breast: Secondary | ICD-10-CM

## 2024-06-16 DIAGNOSIS — H353131 Nonexudative age-related macular degeneration, bilateral, early dry stage: Secondary | ICD-10-CM | POA: Diagnosis not present

## 2024-06-16 DIAGNOSIS — H35372 Puckering of macula, left eye: Secondary | ICD-10-CM | POA: Diagnosis not present

## 2024-06-16 DIAGNOSIS — H52203 Unspecified astigmatism, bilateral: Secondary | ICD-10-CM | POA: Diagnosis not present

## 2024-06-16 DIAGNOSIS — D3131 Benign neoplasm of right choroid: Secondary | ICD-10-CM | POA: Diagnosis not present

## 2024-06-16 DIAGNOSIS — Z961 Presence of intraocular lens: Secondary | ICD-10-CM | POA: Diagnosis not present

## 2024-07-03 DIAGNOSIS — M170 Bilateral primary osteoarthritis of knee: Secondary | ICD-10-CM | POA: Diagnosis not present

## 2024-07-03 DIAGNOSIS — M79672 Pain in left foot: Secondary | ICD-10-CM | POA: Diagnosis not present

## 2024-07-03 DIAGNOSIS — M79671 Pain in right foot: Secondary | ICD-10-CM | POA: Diagnosis not present

## 2024-07-07 DIAGNOSIS — M170 Bilateral primary osteoarthritis of knee: Secondary | ICD-10-CM | POA: Diagnosis not present

## 2024-07-14 ENCOUNTER — Ambulatory Visit
Admission: RE | Admit: 2024-07-14 | Discharge: 2024-07-14 | Disposition: A | Source: Ambulatory Visit | Attending: Obstetrics and Gynecology

## 2024-07-14 DIAGNOSIS — Z1231 Encounter for screening mammogram for malignant neoplasm of breast: Secondary | ICD-10-CM
